# Patient Record
Sex: Male | Born: 1985 | Race: Black or African American | Hispanic: No | Marital: Single | Smoking: Current every day smoker
Health system: Southern US, Community
[De-identification: ages and names within clinical notes are randomized; demographics above are authoritative.]

## PROBLEM LIST (undated history)

## (undated) DIAGNOSIS — K259 Gastric ulcer, unspecified as acute or chronic, without hemorrhage or perforation: Secondary | ICD-10-CM

## (undated) DIAGNOSIS — L98499 Non-pressure chronic ulcer of skin of other sites with unspecified severity: Secondary | ICD-10-CM

## (undated) HISTORY — PX: TONSILLECTOMY: SUR1361

---
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
## 2013-09-30 ENCOUNTER — Encounter (HOSPITAL_BASED_OUTPATIENT_CLINIC_OR_DEPARTMENT_OTHER): Payer: Self-pay | Admitting: Emergency Medicine

## 2013-09-30 ENCOUNTER — Emergency Department (HOSPITAL_BASED_OUTPATIENT_CLINIC_OR_DEPARTMENT_OTHER)
Admission: EM | Admit: 2013-09-30 | Discharge: 2013-09-30 | Disposition: A | Discharge status: Home / Self Care | Payer: Self-pay | Attending: Emergency Medicine | Admitting: Emergency Medicine

## 2013-09-30 DIAGNOSIS — K279 Peptic ulcer, site unspecified, unspecified as acute or chronic, without hemorrhage or perforation: Secondary | ICD-10-CM | POA: Insufficient documentation

## 2013-09-30 DIAGNOSIS — R6883 Chills (without fever): Secondary | ICD-10-CM | POA: Insufficient documentation

## 2013-09-30 DIAGNOSIS — F17200 Nicotine dependence, unspecified, uncomplicated: Secondary | ICD-10-CM | POA: Insufficient documentation

## 2013-09-30 DIAGNOSIS — K625 Hemorrhage of anus and rectum: Secondary | ICD-10-CM | POA: Insufficient documentation

## 2013-09-30 HISTORY — DX: Gastric ulcer, unspecified as acute or chronic, without hemorrhage or perforation: K25.9

## 2013-09-30 LAB — CBC WITH DIFFERENTIAL/PLATELET
BASOS ABS: 0 10*3/uL (ref 0.0–0.1)
BASOS PCT: 1 % (ref 0–1)
EOS PCT: 2 % (ref 0–5)
Eosinophils Absolute: 0.2 10*3/uL (ref 0.0–0.7)
HEMATOCRIT: 35 % — AB (ref 39.0–52.0)
Hemoglobin: 11.8 g/dL — ABNORMAL LOW (ref 13.0–17.0)
Lymphocytes Relative: 51 % — ABNORMAL HIGH (ref 12–46)
Lymphs Abs: 3.2 10*3/uL (ref 0.7–4.0)
MCH: 28.9 pg (ref 26.0–34.0)
MCHC: 33.7 g/dL (ref 30.0–36.0)
MCV: 85.8 fL (ref 78.0–100.0)
MONO ABS: 0.6 10*3/uL (ref 0.1–1.0)
Monocytes Relative: 9 % (ref 3–12)
Neutro Abs: 2.3 10*3/uL (ref 1.7–7.7)
Neutrophils Relative %: 37 % — ABNORMAL LOW (ref 43–77)
Platelets: 267 10*3/uL (ref 150–400)
RBC: 4.08 MIL/uL — ABNORMAL LOW (ref 4.22–5.81)
RDW: 12.8 % (ref 11.5–15.5)
WBC: 6.2 10*3/uL (ref 4.0–10.5)

## 2013-09-30 LAB — COMPREHENSIVE METABOLIC PANEL
ALBUMIN: 3.8 g/dL (ref 3.5–5.2)
ALT: 29 U/L (ref 0–53)
AST: 19 U/L (ref 0–37)
Alkaline Phosphatase: 67 U/L (ref 39–117)
Anion gap: 12 (ref 5–15)
BUN: 12 mg/dL (ref 6–23)
CALCIUM: 9.2 mg/dL (ref 8.4–10.5)
CO2: 24 mEq/L (ref 19–32)
CREATININE: 0.9 mg/dL (ref 0.50–1.35)
Chloride: 108 mEq/L (ref 96–112)
GFR calc Af Amer: >90 mL/min (ref >90)
Glucose, Bld: 83 mg/dL (ref 70–99)
Potassium: 3.9 mEq/L (ref 3.7–5.3)
Sodium: 144 mEq/L (ref 137–147)
Total Bilirubin: 0.3 mg/dL (ref 0.3–1.2)
Total Protein: 6.7 g/dL (ref 6.0–8.3)

## 2013-09-30 LAB — OCCULT BLOOD X 1 CARD TO LAB, STOOL: Fecal Occult Bld: POSITIVE — AB

## 2013-09-30 LAB — LIPASE, BLOOD: LIPASE: 29 U/L (ref 11–59)

## 2013-09-30 MED ORDER — OMEPRAZOLE 20 MG PO CPDR: 20.0000 mg | DELAYED_RELEASE_CAPSULE | Freq: Two times a day (BID) | ORAL | Status: DC

## 2013-09-30 MED ORDER — PANTOPRAZOLE SODIUM 40 MG PO TBEC
40.0000 mg | DELAYED_RELEASE_TABLET | Freq: Once | ORAL | Status: AC
  Administered 2013-09-30: 40 mg via ORAL
  Filled 2013-09-30: qty 1

## 2013-09-30 NOTE — Discharge Instructions (Signed)
Bloody Stools Bloody stools means there is blood in your poop (stool). It is a sign that there is a problem somewhere in the digestive system. It is important for your doctor to find the cause of your bleeding, so the problem can be treated.  HOME CARE  Only take medicine as told by your doctor.  Eat foods with fiber (prunes, bran cereals).  Drink enough fluids to keep your pee (urine) clear or pale yellow.  Sit in warm water (sitz bath) for 10 to 15 minutes as told by your doctor.  Know how to take your medicines (enemas, suppositories) if advised by your doctor.  Watch for signs that you are getting better or getting worse. GET HELP RIGHT AWAY IF:   You are not getting better.  You start to get better but then get worse again.  You have new problems.  You have severe bleeding from the place where poop comes out (rectum) that does not stop.  You throw up (vomit) blood.  You feel weak or pass out (faint).  You have a fever. MAKE SURE YOU:   Understand these instructions.  Will watch your condition.  Will get help right away if you are not doing well or get worse. Document Released: 02/23/2009 Document Revised: 05/30/2011 Document Reviewed: 07/23/2010 [facility] Patient Information 2015 [employer] [place]. This information is not intended to replace advice given to you by your health care provider. Make sure you discuss any questions you have with your health care provider.  Peptic Ulcer A peptic ulcer is a sore in the lining of in your esophagus (esophageal ulcer), stomach (gastric ulcer), or in the first part of your small intestine (duodenal ulcer). The ulcer causes erosion into the deeper tissue. CAUSES  Normally, the lining of the stomach and the small intestine protects itself from the acid that digests food. The protective lining can be damaged by:  An infection caused by a bacterium called Helicobacter pylori (H. pylori).  Regular use of nonsteroidal anti-inflammatory  drugs (NSAIDs), such as ibuprofen or aspirin.  Smoking tobacco. Other risk factors include being older than 50, drinking alcohol excessively, and having a family history of ulcer disease.  SYMPTOMS   Burning pain or gnawing in the area between the chest and the belly button.  Heartburn.  Nausea and vomiting.  Bloating. The pain can be worse on an empty stomach and at night. If the ulcer results in bleeding, it can cause:  Black, tarry stools.  Vomiting of bright red blood.  Vomiting of coffee ground looking materials. DIAGNOSIS  A diagnosis is usually made based upon your history and an exam. Other tests and procedures may be performed to find the cause of the ulcer. Finding a cause will help determine the best treatment. Tests and procedures may include:  Blood tests, stool tests, or breath tests to check for the bacterium H. pylori.  An upper gastrointestinal (GI) series of the esophagus, stomach, and small intestine.  An endoscopy to examine the esophagus, stomach, and small intestine.  A biopsy. TREATMENT  Treatment may include:  Eliminating the cause of the ulcer, such as smoking, NSAIDs, or alcohol.  Medicines to reduce the amount of acid in your digestive tract.  Antibiotic medicines if the ulcer is caused by the H. pylori bacterium.  An upper endoscopy to treat a bleeding ulcer.  Surgery if the bleeding is severe or if the ulcer created a hole somewhere in the digestive system. HOME CARE INSTRUCTIONS   Avoid tobacco, alcohol, and  caffeine. Smoking can increase the acid in the stomach, and continued smoking will impair the healing of ulcers.  Avoid foods and drinks that seem to cause discomfort or aggravate your ulcer.  Only take medicines as directed by your caregiver. Do not substitute over-the-counter medicines for prescription medicines without talking to your caregiver.  Keep any follow-up appointments and tests as directed. SEEK MEDICAL CARE IF:    Your do not improve within 7 days of starting treatment.  You have ongoing indigestion or heartburn. SEEK IMMEDIATE MEDICAL CARE IF:   You have sudden, sharp, or persistent abdominal pain.  You have bloody or dark black, tarry stools.  You vomit blood or vomit that looks like coffee grounds.  You become light headed, weak, or feel faint.  You become sweaty or clammy. MAKE SURE YOU:   Understand these instructions.  Will watch your condition.  Will get help right away if you are not doing well or get worse. Document Released: 03/04/2000 Document Revised: 11/30/2011 Document Reviewed: 10/05/2011 [facility] Patient Information 2015 [place], [place]. This information is not intended to replace advice given to you by your health care provider. Make sure you discuss any questions you have with your health care provider.

## 2013-09-30 NOTE — ED Notes (Signed)
Pa  at bedside. 

## 2013-09-30 NOTE — ED Notes (Signed)
Rectal bleeding since last night.  Denies pain

## 2013-09-30 NOTE — ED Provider Notes (Addendum)
CSN: [id number]     Arrival date & time 09/30/13  1512 History  This chart was scribed for Shanna Cisco, MD by Quintella Reichert, ED scribe.  This patient was seen in room MH06/MH06 and the patient's care was started at 4:19 PM.   Chief Complaint  Patient presents with  . Rectal Bleeding    Patient is a 28 y.o. male presenting with hematochezia. The history is provided by the patient. No language interpreter was used.  Rectal Bleeding Quality:  Maroon Duration: Began last night. Timing: with every bowel movement. Chronicity:  New Similar prior episodes: no   Ineffective treatments:  None tried Associated symptoms: abdominal pain (mild intermittent sharp pains)   Associated symptoms: no dizziness, no fever and no vomiting   Risk factors: no anticoagulant use, no hx of colorectal cancer, no hx of colorectal surgery, no liver disease and no NSAID use     HPI Comments: Dillon Nelson is a 28 y.o. male who presents to the Emergency Department complaining of rectal bleeding that began last night.  Pt states whenever he moves his bowels he notices dark red blood mixed in with his stool and on the outside of the stool.  He has seen this 3 times, once last night and twice today, around 10:30 AM and 12:30 PM.  Stool is otherwise normal.  He denies rectal pain including with bowel movements.  He does reports some mild intermittent sharp abdominal pain.  He had some chills last night but denies fever.  He denies dysuria or hematuria.  Pt denies prior h/o similar symptoms.  He has h/o gastric ulcers about 4 years ago but states his current symptoms are distinct as he experienced hematemesis, more severe abdominal pain, and rectal discharge at that time.  Since then he states that he occasionally experiences intermittent abdominal pains that move around the center of his abdomen and which last about 1-2 days.  She last had one about 1 month ago.  He has h/o painful hemorrhoids as well but states his  current symptoms do not feel similar.  He denies h/o abdominal surgery.  He drinks alcohol occasionally and last drank 5 days ago.  He does not take any OTC medications including NSAIDs or Tylenol, and denies illicit drug use.    Past Medical History  Diagnosis Date  . Multiple gastric ulcers     Past Surgical History  Procedure Laterality Date  . Tonsillectomy      History reviewed. No pertinent family history.   History  Substance Use Topics  . Smoking status: Current Every Day Smoker -- 0.50 packs/day    Types: Cigarettes  . Smokeless tobacco: Not on file  . Alcohol Use: No     Review of Systems  Constitutional: Positive for chills. Negative for fever, activity change, appetite change and fatigue.  HENT: Negative for congestion, facial swelling, rhinorrhea and trouble swallowing.   Eyes: Negative for photophobia and pain.  Respiratory: Negative for cough, chest tightness and shortness of breath.   Cardiovascular: Negative for chest pain and leg swelling.  Gastrointestinal: Positive for abdominal pain (mild intermittent sharp pains), blood in stool and hematochezia. Negative for nausea, vomiting, diarrhea and constipation.  Endocrine: Negative for polydipsia and polyuria.  Genitourinary: Negative for dysuria, urgency, hematuria, decreased urine volume and difficulty urinating.  Musculoskeletal: Negative for back pain and gait problem.  Skin: Negative for color change, rash and wound.  Allergic/Immunologic: Negative for immunocompromised state.  Neurological: Negative for dizziness, facial asymmetry, speech difficulty,  weakness, numbness and headaches.  Psychiatric/Behavioral: Negative for confusion, decreased concentration and agitation.      Allergies  Review of patient's allergies indicates no known allergies.  Home Medications   Prior to Admission medications   Medication Sig Start Date End Date Taking? Authorizing Provider  omeprazole (PRILOSEC) 20 MG capsule  Take 1 capsule (20 mg total) by mouth 2 (two) times daily before a meal. 09/30/13   Shanna Cisco, MD   BP 142/86  Pulse 70  Temp(Src) 98.1 F (36.7 C) (Oral)  Resp 18  Ht 5\' 11"  (1.803 m)  Wt 160 lb (72.576 kg)  BMI 22.33 kg/m2  SpO2 100%  Physical Exam  Nursing note and vitals reviewed. Constitutional: He is oriented to person, place, and time. He appears well-developed and well-nourished. No distress.  HENT:  Head: Normocephalic and atraumatic.  Mouth/Throat: No oropharyngeal exudate.  Eyes: Pupils are equal, round, and reactive to light.  Neck: Normal range of motion. Neck supple.  Cardiovascular: Normal rate, regular rhythm and normal heart sounds.  Exam reveals no gallop and no friction rub.   No murmur heard. Pulmonary/Chest: Effort normal and breath sounds normal. No respiratory distress. He has no wheezes. He has no rales.  Abdominal: Soft. Bowel sounds are normal. He exhibits no distension and no mass. There is tenderness in the epigastric area and left upper quadrant. There is no rebound and no guarding.  Genitourinary:  Scant maroon-colored stool  Musculoskeletal: Normal range of motion. He exhibits no edema and no tenderness.  Neurological: He is alert and oriented to person, place, and time.  Skin: Skin is warm and dry.  Psychiatric: He has a normal mood and affect.    ED Course  Procedures (including critical care time)  DIAGNOSTIC STUDIES: Oxygen Saturation is 100% on room air, normal by my interpretation.    COORDINATION OF CARE: 4:28 PM-Discussed treatment plan which includes labs and stool sample with pt at bedside and pt agreed to plan.     Labs Review Labs Reviewed  CBC WITH DIFFERENTIAL - Abnormal; Notable for the following:    RBC 4.08 (*)    Hemoglobin 11.8 (*)    HCT 35.0 (*)    Neutrophils Relative % 37 (*)    Lymphocytes Relative 51 (*)    All other components within normal limits  OCCULT BLOOD X 1 CARD TO LAB, STOOL - Abnormal; Notable  for the following:    Fecal Occult Bld POSITIVE (*)    All other components within normal limits  COMPREHENSIVE METABOLIC PANEL  LIPASE, BLOOD    Imaging Review No results found.   EKG Interpretation None      MDM   Final diagnoses:  Rectal bleeding  Peptic ulcer disease    Pt is a 28 y.o. male with Pmhx as above who presents with rectal bleeding x3 episodes since last night associated w/ BMs, described as dark red mixed w/ stool. He has intermittent sharp upper abdominal pain. No rectal pain, no fever, n/v, urinary symptoms. On PE, VSS, pt in NAD. +ttp epigastrium and LUQ w/o rebound or guarding. Heme stool positive, scant maroon colored stool. Hx 11.8 w/o priors. Last episode about 5 hrs ago. CMP, lipase nml. Given hx, suspect peptic ulcer disease w/ bleeding ulcer is cause of rectal bleeding. Exam not c/w perforation. As pt is HDS, in a NAD and not reporting significant abdominal pain, will start on BID omeprazole.  Strict return precautions given for new or worsening symptoms including worsening abdominal  pain, fever, hematemesis, inc rectal bleeding, syncope/near syncope.      I personally performed the services described in this documentation, which was scribed in my presence. The recorded information has been reviewed and is accurate.    Shanna CiscoMegan E Liem Copenhaver, MD 09/30/13 1723  Shanna CiscoMegan E Rastus Borton, MD 09/30/13 [phone]

## 2013-10-31 ENCOUNTER — Inpatient Hospital Stay (HOSPITAL_COMMUNITY)
Admission: EM | Admit: 2013-10-31 | Discharge: 2013-10-31 | Discharge status: Against Medical Advice | Payer: Self-pay | Attending: Obstetrics & Gynecology | Admitting: Obstetrics & Gynecology

## 2014-01-02 ENCOUNTER — Emergency Department (HOSPITAL_BASED_OUTPATIENT_CLINIC_OR_DEPARTMENT_OTHER)
Admission: EM | Admit: 2014-01-02 | Discharge: 2014-01-03 | Disposition: A | Discharge status: Home / Self Care | Payer: Self-pay | Attending: Emergency Medicine | Admitting: Emergency Medicine

## 2014-01-02 ENCOUNTER — Encounter (HOSPITAL_BASED_OUTPATIENT_CLINIC_OR_DEPARTMENT_OTHER): Payer: Self-pay | Admitting: Emergency Medicine

## 2014-01-02 ENCOUNTER — Emergency Department (HOSPITAL_COMMUNITY): Payer: Self-pay

## 2014-01-02 DIAGNOSIS — M549 Dorsalgia, unspecified: Secondary | ICD-10-CM | POA: Insufficient documentation

## 2014-01-02 DIAGNOSIS — R200 Anesthesia of skin: Secondary | ICD-10-CM | POA: Insufficient documentation

## 2014-01-02 DIAGNOSIS — M5126 Other intervertebral disc displacement, lumbar region: Secondary | ICD-10-CM | POA: Insufficient documentation

## 2014-01-02 DIAGNOSIS — G839 Paralytic syndrome, unspecified: Secondary | ICD-10-CM

## 2014-01-02 MED ORDER — SODIUM CHLORIDE 0.9 % IV BOLUS (SEPSIS)
1000.0000 mL | Freq: Once | INTRAVENOUS | Status: AC
  Administered 2014-01-02: 1000 mL via INTRAVENOUS

## 2014-01-02 MED ORDER — MORPHINE SULFATE 4 MG/ML IJ SOLN
6.0000 mg | Freq: Once | INTRAMUSCULAR | Status: AC
  Administered 2014-01-02: 6 mg via INTRAVENOUS
  Filled 2014-01-02: qty 2

## 2014-01-02 NOTE — ED Provider Notes (Signed)
CSN: [id number]     Arrival date & time 01/02/14  1858 History   First MD Initiated Contact with Patient 01/02/14 2006     Chief Complaint  Patient presents with  . Numbness     (Consider location/radiation/quality/duration/timing/severity/associated sxs/prior Treatment) HPI Comments:  Patient is a 28 year old male transferred from [facility] long emergency department after having sudden onset of numbness and weakness to bilateral legs yesterday. He was doing his normal activity when his legs  Became numb and he fell to the floor he states his legs  Then went paralyzed.  He states this lasted for about an hour. He denied any urinary or fecal incontinence. He did have some low back pain at this time as well. He states that his symptoms gradually resolved and now  His only complaint is that when he leans upright his back hurts.  He does have some chronic low back pain but he states nothing like this.  Denies any current numbness, weakness.  Pain is alleviated by leaning  Forward.  Seen in [facility] recommended MRIs and transferred here  The history is provided by the patient and medical records.    Past Medical History  Diagnosis Date  . Multiple gastric ulcers    Past Surgical History  Procedure Laterality Date  . Tonsillectomy     No family history on file. History  Substance Use Topics  . Smoking status: Current Every Day Smoker -- 0.50 packs/day    Types: Cigarettes  . Smokeless tobacco: Not on file  . Alcohol Use: Yes    Review of Systems  Constitutional: Negative for fever, activity change and appetite change.  HENT: Negative for congestion and rhinorrhea.   Eyes: Negative for discharge and itching.  Respiratory: Negative for cough, shortness of breath and wheezing.   Cardiovascular: Negative for chest pain.  Gastrointestinal: Negative for nausea, vomiting, abdominal pain, diarrhea and constipation.  Genitourinary: Negative for hematuria, decreased urine volume and  difficulty urinating.  Musculoskeletal: Positive for back pain.  Skin: Negative for rash and wound.  Neurological: Positive for weakness ( resolved) and numbness ( resolved). Negative for syncope.  All other systems reviewed and are negative.     Allergies  Review of patient's allergies indicates no known allergies.  Home Medications   Prior to Admission medications   Medication Sig Start Date End Date Taking? Authorizing Provider  methylPREDNIsolone (MEDROL DOSPACK) 4 MG tablet follow package directions 01/03/14   Pilar Jarvis, MD  omeprazole (PRILOSEC) 20 MG capsule Take 1 capsule (20 mg total) by mouth 2 (two) times daily before a meal. 09/30/13   Toy Cookey, MD   BP 154/87  Pulse 110  Temp(Src) 99.2 F (37.3 C) (Oral)  Resp 16  Ht 5\' 8"  (1.727 m)  Wt 174 lb (78.926 kg)  BMI 26.46 kg/m2  SpO2 98% Physical Exam  Vitals reviewed. Constitutional: He is oriented to person, place, and time. He appears well-developed and well-nourished. No distress.  HENT:  Head: Normocephalic and atraumatic.  Mouth/Throat: Oropharynx is clear and moist. No oropharyngeal exudate.  Eyes: Conjunctivae and EOM are normal. Pupils are equal, round, and reactive to light. Right eye exhibits no discharge. Left eye exhibits no discharge. No scleral icterus.  Neck: Normal range of motion. Neck supple.  Cardiovascular: Normal rate, regular rhythm, normal heart sounds and intact distal pulses.  Exam reveals no gallop and no friction rub.   No murmur heard. Pulmonary/Chest: Effort normal and breath sounds normal. No respiratory distress. He has no wheezes.  He has no rales.  Abdominal: Soft. He exhibits no distension and no mass. There is no tenderness.  Musculoskeletal: Normal range of motion.  No tenderness to palpation of CTLs  Neurological: He is alert and oriented to person, place, and time. No cranial nerve deficit. He exhibits normal muscle tone. Coordination normal.  5 out of 5 strength in all  extremities  normal sensation in all extremities  able to ambulate  But leans forward for  Comfort  2+ DTRs and patellar and brachioradialis Cranial nerves II through XII intact  Skin: Skin is warm. No rash noted. He is not diaphoretic.    ED Course  Procedures (including critical care time) Labs Review Labs Reviewed - No data to display  Imaging Review Mr Lumbar Spine Wo Contrast  01/02/2014   CLINICAL DATA:  Initial valuation for sudden onset numbness in bilateral legs that began yesterday.  EXAM: MRI LUMBAR SPINE WITHOUT CONTRAST  TECHNIQUE: Multiplanar, multisequence MR imaging of the lumbar spine was performed. No intravenous contrast was administered.  COMPARISON:  None available  FINDINGS: For the purposes of this dictation, the lowest well-formed intervertebral disc spaces presumed to be the L5-S1 level, and there presumed to be 5 lumbar type vertebral bodies.  Vertebral bodies are normally aligned with preservation of the normal lumbar lordosis. Vertebral body heights are preserved. Signal intensity within the vertebral body bone marrow is normal. No focal osseous lesion.  The conus medullaris terminates normally at the level of L1. Signal intensity within the visualized cord is within normal limits. Nerve roots of the cauda equina are position dependently within the thecal sac without abnormality.  Paraspinous soft tissues within normal limits.  Diffuse congenital shortening of the pedicles is present.  T12-L1:  Negative.  L1-2:  Negative.  L2-3:  Negative.  L3-4: Small central disc protrusion indents the ventral thecal sac. There is an associated annular fissure. Mild bilateral facet hypertrophy. These changes superimposed on short pedicles results in mild canal and lateral recess stenosis bilaterally. There is mild left foraminal narrowing.  L4-5: There is a central/right paracentral disc protrusion minimally indents the ventral thecal sac. There is mild bilateral facet hypertrophy. These  changes superimposed on short pedicles results in moderate canal and bilateral lateral recess stenosis. There is moderate bilateral foraminal narrowing as well.  L5-S1: Mild diffuse disc bulge with superimposed small central disc protrusion. Mild bilateral facet arthrosis. There is mild canal and moderate bilateral foraminal narrowing.  IMPRESSION: 1. Small central disc protrusion with bilateral facet hypertrophy and short pedicles at L3-4 with resultant mild canal and lateral recess stenosis bilaterally. 2. Small central/right paracentral disc protrusion at L4-5, which superimposed on short pedicles results in moderate canal and bilateral foraminal stenosis. 3. Small central disc protrusion at L5-S1 with resultant mild canal and moderate bilateral foraminal narrowing. 4. Diffuse congenital shortening of the pedicles.   Electronically Signed   By: Rise MuBenjamin  McClintock M.D.   On: 01/02/2014 22:54     EKG Interpretation None      MDM   MDM: 28 year old male  [facility]  Do to paralysis yesterday. Patient here with normal neurological exam. MRI obtained shows results as above. Discussed with Dr. Yetta BarreJones of neurosurgery who feels that patient's symptoms are not explained by his MRI and feels that his MRI is benign. He did not feel patient needs emergent neurosurgical consult or urgent neurosurgical followup.  Recommended  Neurology consultation.  Neurology is seeing patient feels his symptoms are explained  by herniated disc. He recommends a Medrol Dosepak as well as followup as outpatient.  We will discharge patient. Instructed to return if symptoms return.  Patient given morphine for pain  In emergency department.   Symptoms unlikely GBS as not progressive.  Discharged.  Final diagnoses:  Paralysis  Herniated lumbar disc without myelopathy    New Prescriptions   METHYLPREDNISOLONE (MEDROL DOSPACK) 4 MG TABLET    follow package directions      [facility]  [facility]  [facility] [address] [place] [id number] [phone] Schedule an appointment as soon as possible for a visit    Discharged  Pilar Jarvisoug Satonya Lux, MD 01/03/14 [phone]

## 2014-01-02 NOTE — ED Notes (Signed)
Pt states "i went paralyzed from my waist down last night"-states he was unable to walk but now can walk-denies injury-pt also c/o prod cough x 2 days

## 2014-01-02 NOTE — ED Notes (Signed)
Pt c/o numbness and no feeling in lower ext last pm lasting approx 45 minutes,  states legs gave away and he was unable too move and had fo feeling

## 2014-01-02 NOTE — ED Notes (Signed)
Pt called x1 no answer in lobby 

## 2014-01-02 NOTE — ED Provider Notes (Signed)
CSN: [id number]     Arrival date & time 01/02/14  1858 History  This chart was scribed for Nelia Shiobert L Sina Sumpter, MD by Bronson CurbJacqueline Melvin, ED Scribe. This patient was seen in room MH05/MH05 and the patient's care was started at 8:07 PM.      Chief Complaint  Patient presents with  . Numbness    The history is provided by the patient. No language interpreter was used.    HPI Comments: Dillon Nelson is a 28 y.o. male who presents to the Emergency Department complaining of sudden onset of numbness to the bilateral legs that began yesterday. Patient states he was cleaning around the house when his "legs suddenly went numb" and he fell to the floor and his legs were paralyzed and would not move.  He denies urinary or fecal incontinence. He denies any heavy lifting. Wife states the patient was on the floor for 45 minutes to an hour because he was unable to get up. He states he is also experiencing flu-like symptoms and did not immediately come the ED for his numbness because he thought this was related. He also noted associated low back pain. Patient is ambulatory at present, however, he states he is unable to maintain a upright posture. Patient denies bowel/bladder incontinence, or any numbness or weakness at present. Patient has history of chronic back pain, but denies history of back surgery or IV drug use.  Past Medical History  Diagnosis Date  . Multiple gastric ulcers    Past Surgical History  Procedure Laterality Date  . Tonsillectomy     No family history on file. History  Substance Use Topics  . Smoking status: Current Every Day Smoker -- 0.50 packs/day    Types: Cigarettes  . Smokeless tobacco: Not on file  . Alcohol Use: Yes    Review of Systems  Musculoskeletal: Positive for back pain.  Neurological: Positive for weakness and numbness.  All other systems reviewed and are negative.     Allergies  Review of patient's allergies indicates no known allergies.  Home  Medications   Prior to Admission medications   Medication Sig Start Date End Date Taking? Authorizing Provider  omeprazole (PRILOSEC) 20 MG capsule Take 1 capsule (20 mg total) by mouth 2 (two) times daily before a meal. 09/30/13   Toy CookeyMegan Docherty, MD   Triage Vitals: BP 154/87  Pulse 110  Temp(Src) 99.2 F (37.3 C) (Oral)  Resp 16  Ht 5\' 8"  (1.727 m)  Wt 174 lb (78.926 kg)  BMI 26.46 kg/m2  SpO2 98%  Physical Exam Physical Exam  Nursing note and vitals reviewed. Constitutional: He is oriented to person, place, and time. He appears well-developed and well-nourished. No distress.  HENT:  Head: Normocephalic and atraumatic.  Eyes: Pupils are equal, round, and reactive to light.  Neck: Normal range of motion.  Cardiovascular: Normal rate and intact distal pulses.   Pulmonary/Chest: No respiratory distress.  Abdominal: Normal appearance. He exhibits no distension.  Musculoskeletal: Normal range of motion.  Pain in the low lumbar spine area to palpation and with movement.  Neurological: He is alert and oriented to person, place, and time. No cranial nerve deficit.  Patient able to move all extremities although movement of the lower extremity causes pain.  He has no definite numbness to lower trimmings.  Skin: Skin is warm and dry. No rash noted.  Psychiatric: He has a normal mood and affect. His behavior is normal.   ED Course  Procedures (including critical care time)  DIAGNOSTIC STUDIES: Oxygen Saturation is 98% on room air, normal by my interpretation.    COORDINATION OF CARE: At 2014 Discussed treatment plan with patient which includes transport to another facility to have an MRI performed. Patient agrees.   Labs Review Labs Reviewed - No data to display  Imaging Review No results found.  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  His neurological exam is improved from last night when he states that he was totally paralyzed.  He says he can ambulate  but pain in his back is still significant.  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  I spoke with the radiologist who agreed and [facility]  Patient is requesting to go by private vehicle.  Further evaluation can be done after the scan for his "cold symptoms".  MDM   Final diagnoses:  Paralysis      I personally performed the services described in this documentation, which was scribed in my presence. The recorded information has been reviewed and considered.   Nelia Shiobert L Viktorya Arguijo, MD 01/02/14 2040

## 2014-01-02 NOTE — ED Notes (Signed)
Report given to [place]enn[place], charge [facility] at [facility] was given report and expetected time of time of arrival

## 2014-01-03 DIAGNOSIS — G839 Paralytic syndrome, unspecified: Secondary | ICD-10-CM

## 2014-01-03 MED ORDER — METHYLPREDNISOLONE (PAK) 4 MG PO TABS: ORAL_TABLET | ORAL | Status: DC

## 2014-01-03 NOTE — Consult Note (Signed)
Consult Reason for Consult: transient bilateral LE paralysis Referring Physician: Dr Radford Pax  CC: My legs gave out  HPI: Dillon Nelson is a 28 y.o. male who presents to the Emergency Department complaining of sudden onset of numbness to the bilateral legs that began yesterday. Patient states he was cleaning around the house when his "legs suddenly went numb" and he fell to the floor and his legs were paralyzed and would not move. Notes every time he tried to move he would have a severe stabbing pain in his lumbar region. He denies urinary or fecal incontinence. He denies any heavy lifting. Wife states the patient was on the floor for 45 minutes to an hour because he was unable to get up. He notes ever time he tried to get up would have severe pain and need to sit back down. He states he is also experiencing flu-like symptoms and did not immediately come the ED for his numbness because he thought this was related. He also noted associated low back pain. Patient is ambulatory at present, however, he states he is unable to maintain a upright posture. Patient denies bowel/bladder incontinence, or any numbness or weakness at present. Patient has history of chronic back pain, but denies history of back surgery or IV drug use.  MRI L spine completed in ED, results discussed with neurosurgery who felt there was no surgical intervention indicated at this time.   Past Medical History  Diagnosis Date  . Multiple gastric ulcers     Past Surgical History  Procedure Laterality Date  . Tonsillectomy      No family history on file.  Social History:  reports that he has been smoking Cigarettes.  He has been smoking about 0.50 packs per day. He does not have any smokeless tobacco history on file. He reports that he drinks alcohol. He reports that he uses illicit drugs (Marijuana).  No Known Allergies   ROS: Out of a complete 14 system review, the patient complains of only the following symptoms, and all  other reviewed systems are negative.  Physical Examination: Blood pressure 154/87, pulse 110, temperature 99.2 F (37.3 C), temperature source Oral, resp. rate 16, height 5\' 8"  (1.727 m), weight 78.926 kg (174 lb), SpO2 98.00%.  Neurologic Examination Mental Status: Alert, oriented, thought content appropriate.  Speech fluent without evidence of aphasia.  Able to follow 3 step commands without difficulty. Cranial Nerves: II: funduscopic exam wnl bilaterally, visual fields grossly normal, pupils equal, round, reactive to light and accommodation III,IV, VI: ptosis not present, extra-ocular motions intact bilaterally V,VII: smile symmetric, facial light touch sensation normal bilaterally VIII: hearing normal bilaterally IX,X: gag reflex present XI: trapezius strength/neck flexion strength normal bilaterally XII: tongue strength normal  Motor: Right : Upper extremity    Left:     Upper extremity 5/5 deltoid       5/5 deltoid 5/5 biceps      5/5 biceps  5/5 triceps      5/5 triceps 5/5wrist flexion     5/5 wrist flexion 5/5 wrist extension     5/5 wrist extension 5/5 hand grip      5/5 hand grip  Lower extremity     Lower extremity 4+/5 hip flexor      4+/5 hip flexor 5-/5 quadricep      5-/5 quadriceps  5-/5 hamstrings     5-/5 hamstrings 5/5 plantar flexion       5/5 plantar flexion 5/5 plantar extension     5/5 plantar  extension *weakness appears pain related, intermittently able to give brief full strength effor Tone and bulk:normal tone throughout; no atrophy noted Sensory: Pinprick and light touch intact throughout, bilaterally Deep Tendon Reflexes: 1+ and symmetric throughout Plantars: Right: downgoing   Left: downgoing Cerebellar: normal finger-to-nose, due to pain did not attempt HTS Gait: refused walking due to pain  Laboratory Studies:   Basic Metabolic Panel: No results found for this basename: NA, K, CL, CO2, GLUCOSE, BUN, CREATININE, CALCIUM, MG, PHOS,  in the last  168 hours  Liver Function Tests: No results found for this basename: AST, ALT, ALKPHOS, BILITOT, PROT, ALBUMIN,  in the last 168 hours No results found for this basename: LIPASE, AMYLASE,  in the last 168 hours No results found for this basename: AMMONIA,  in the last 168 hours  CBC: No results found for this basename: WBC, NEUTROABS, HGB, HCT, MCV, PLT,  in the last 168 hours  Cardiac Enzymes: No results found for this basename: CKTOTAL, CKMB, CKMBINDEX, TROPONINI,  in the last 168 hours  BNP: No components found with this basename: POCBNP,   CBG: No results found for this basename: GLUCAP,  in the last 168 hours  Microbiology: No results found for this or any previous visit.  Coagulation Studies: No results found for this basename: LABPROT, INR,  in the last 72 hours  Urinalysis: No results found for this basename: COLORURINE, APPERANCEUR, LABSPEC, PHURINE, GLUCOSEU, HGBUR, BILIRUBINUR, KETONESUR, PROTEINUR, UROBILINOGEN, NITRITE, LEUKOCYTESUR,  in the last 168 hours  Lipid Panel:  No results found for this basename: chol, trig, hdl, cholhdl, vldl, ldlcalc    HgbA1C:  No results found for this basename: HGBA1C    Urine Drug Screen:   No results found for this basename: labopia, cocainscrnur, labbenz, amphetmu, thcu, labbarb    Alcohol Level: No results found for this basename: ETH,  in the last 168 hours   Imaging: Mr Lumbar Spine Wo Contrast  01/02/2014   CLINICAL DATA:  Initial valuation for sudden onset numbness in bilateral legs that began yesterday.  EXAM: MRI LUMBAR SPINE WITHOUT CONTRAST  TECHNIQUE: Multiplanar, multisequence MR imaging of the lumbar spine was performed. No intravenous contrast was administered.  COMPARISON:  None available  FINDINGS: For the purposes of this dictation, the lowest well-formed intervertebral disc spaces presumed to be the L5-S1 level, and there presumed to be 5 lumbar type vertebral bodies.  Vertebral bodies are normally aligned  with preservation of the normal lumbar lordosis. Vertebral body heights are preserved. Signal intensity within the vertebral body bone marrow is normal. No focal osseous lesion.  The conus medullaris terminates normally at the level of L1. Signal intensity within the visualized cord is within normal limits. Nerve roots of the cauda equina are position dependently within the thecal sac without abnormality.  Paraspinous soft tissues within normal limits.  Diffuse congenital shortening of the pedicles is present.  T12-L1:  Negative.  L1-2:  Negative.  L2-3:  Negative.  L3-4: Small central disc protrusion indents the ventral thecal sac. There is an associated annular fissure. Mild bilateral facet hypertrophy. These changes superimposed on short pedicles results in mild canal and lateral recess stenosis bilaterally. There is mild left foraminal narrowing.  L4-5: There is a central/right paracentral disc protrusion minimally indents the ventral thecal sac. There is mild bilateral facet hypertrophy. These changes superimposed on short pedicles results in moderate canal and bilateral lateral recess stenosis. There is moderate bilateral foraminal narrowing as well.  L5-S1: Mild diffuse disc bulge with superimposed  small central disc protrusion. Mild bilateral facet arthrosis. There is mild canal and moderate bilateral foraminal narrowing.  IMPRESSION: 1. Small central disc protrusion with bilateral facet hypertrophy and short pedicles at L3-4 with resultant mild canal and lateral recess stenosis bilaterally. 2. Small central/right paracentral disc protrusion at L4-5, which superimposed on short pedicles results in moderate canal and bilateral foraminal stenosis. 3. Small central disc protrusion at L5-S1 with resultant mild canal and moderate bilateral foraminal narrowing. 4. Diffuse congenital shortening of the pedicles.   Electronically Signed   By: Rise MuBenjamin  McClintock M.D.   On: 01/02/2014 22:54      Assessment/Plan:  Felicie MornChristopher Nelson is a 28 y.o. male who presents to the Emergency Department complaining of sudden onset of numbness/weakness to the bilateral legs yesterday that lasted for 45 minutes and then resolved. Currently with severe lumbar back pain and pain related weakness. Suspect symptoms are likely related to pain from a herniated disc. Based on clinical history and resolution of symptoms this history is not consistent with a GBS type picture. Imaging reviewed by neurosurgery who felt patient did not warrant urgent neurosurgical intervention. Suggest Medrol dose pack and outpatient neurology follow up for EMG/NCS.     Elspeth Choeter Dwaine Pringle, DO [facility] [phone]  If 7pm- 7am, please page neurology on call as listed in [facility]. 01/03/2014, 12:06 AM

## 2014-01-03 NOTE — ED Provider Notes (Signed)
I have personally seen and examined the patient.  I have discussed the plan of care with the resident.  I have reviewed the documentation on PMH/FH/Soc. History.  I have reviewed the documentation of the resident and agree.  Pt stable in the ED, no focal motor deficits in his lower extremities, consults appreciated, appropriate for d/c home  Joya Gaskinsonald W Ivanna Kocak, MD 01/03/14 1229

## 2014-01-03 NOTE — Discharge Instructions (Signed)
Herniated Disk A herniated disk occurs when a disk in your spine bulges out too far. This condition is also called a ruptured disk or slipped disk. Your spine (backbone) is made up of bones called vertebrae. Between each pair of vertebrae is an oval disk with a soft, spongy center that acts as a shock absorber when you move. The spongy center is surrounded by a tough outer ring. When you have a herniated disk, the spongy center of the disk bulges out or ruptures through the outer ring. A herniated disk can press on a nerve between your vertebrae and cause pain. A herniated disk can occur anywhere in your back or neck area, but the lower back is the most common spot. CAUSES  In many cases, a herniated disk occurs just from getting older. As you age, the spongy insides of your disks tend to shrink and dry out. A herniated disk can result from gradual wear and tear. Injury or sudden strain can also cause a herniated disk.  RISK FACTORS Aging is the main risk factor for a herniated disk. Other risk factors include:  Being a man between the ages of 30 and 50 years.  Having a job that requires heavy lifting, bending, or twisting.  Having a job that requires long hours of driving.  Not getting enough exercise.  Being overweight.  Smoking. SIGNS AND SYMPTOMS  Signs and symptoms depend on which disk is herniated.  For a herniated disk in the lower back, you may have sharp pain in:  One part of your leg, hip, or buttocks.  The back of your calf.  The top or sole of your foot (sciatica).   For a herniated disk in the neck, you may feel pain:  When you move your neck.  Near or over your shoulder blade.  That moves to your upper arm, forearm, or fingers.   You may also have muscle weakness. It may be hard to:  Lift your leg or arm.  Stand on your toes.  Squeeze tightly with one of your hands.  Other symptoms can include:  Numbness or tingling in the affected areas of your  body.  Loss of bladder or bowel control. This is a rare but serious sign of a severe herniated disk in the lower back. DIAGNOSIS  Your health care provider will do a physical exam. During this exam, you may have to move certain body parts or assume various positions. For example, your health care provider may do the straight-leg test. This is a good way to test for a herniated disk in your lower back. In this test, the health care provider lifts your leg while you lie on your back. This is to see if you feel pain down your leg. Your health care provider will also check for numbness or loss of feeling.  Your health care provider will also check your:  Reflexes.  Muscle strength.  Posture.  Other tests may be done to help in making a diagnosis. These may include:  An X-ray of the spine to rule out other causes of back pain.   Other imaging studies, such as an MRI or CT scan. This is to check whether the herniated disk is pressing on your spinal canal.  Electromyography (EMG). This test checks the nerves that control muscles. It is sometimes used to identify the specific area of nerve involvement.  TREATMENT  In many cases, herniated disk symptoms go away over a period of days or weeks. You will most   likely be free of symptoms in 3-4 months. Treatment may include the following:  The initial treatment for a herniated disk is ashort period of rest.  Bed rest is often limited to 1 or 2 days. Resting for too long delays recovery.  If you have a herniated disk in your lower back, you should avoid sitting as much as possible because sitting increases pressure on the disk.  Medicines. These may include:   Nonsteroidal anti-inflammatory drugs (NSAIDs).  Muscle relaxants for back spasms.  Narcotic pain medicine if your pain is very bad.   Steroid injections. You may need these along the involved nerve root to help control pain. The steroid is injected in the area of the herniated disk.  It helps by reducing swelling around the disk.  Physical therapy. This may include exercises to strengthen the muscles that help support your spine.   You may need surgery if other treatments do not work.  HOME CARE INSTRUCTIONS Follow all your health care provider's instructions. These may include:  Take all medicines as directed by your health care provider.  Rest for 2 days and then start moving.  Do not sit or stand for long periods of time.  Maintain good posture when sitting and standing.  Avoid movements that cause pain, such as bending or lifting.  When you are able to start lifting things again:  Bend with your knees.  Keep your back straight.  Hold heavy objects close to your body.  If you are overweight, ask your health care provider to help you start a weight-loss program.  When you are able to start exercising, ask your health care provider how much and what type of exercise is best for you.  Work with a physical therapist on stretching and strengthening exercises for your back.  Do not wear high-heeled shoes.  Do not sleep on your belly.  Do not smoke.  Keep all follow-up visits as directed by your health care provider. SEEK MEDICAL CARE IF:  You have back or neck pain that is not getting better after 4 weeks.  You have very bad pain in your back or neck.  You develop numbness, tingling, or weakness along with pain. SEEK IMMEDIATE MEDICAL CARE IF:   You have numbness, tingling, or weakness that makes you unable to use your arms or legs.  You lose control of your bladder or bowels.  You have dizziness or fainting.  You have shortness of breath.  MAKE SURE YOU:   Understand these instructions.  Will watch your condition.  Will get help right away if you are not doing well or get worse. Document Released: 03/04/2000 Document Revised: 07/22/2013 Document Reviewed: 02/08/2013 Exit[employer] Patient Information 2015 [employer]. This information  is not intended to replace advice given to you by your health care provider. Make sure you discuss any questions you have with your health care provider.  

## 2014-06-12 ENCOUNTER — Encounter (HOSPITAL_BASED_OUTPATIENT_CLINIC_OR_DEPARTMENT_OTHER): Payer: Self-pay

## 2014-06-12 ENCOUNTER — Emergency Department (HOSPITAL_BASED_OUTPATIENT_CLINIC_OR_DEPARTMENT_OTHER)
Admission: EM | Admit: 2014-06-12 | Discharge: 2014-06-12 | Disposition: A | Discharge status: Home / Self Care | Payer: Worker's Compensation | Attending: Emergency Medicine | Admitting: Emergency Medicine

## 2014-06-12 DIAGNOSIS — Z720 Tobacco use: Secondary | ICD-10-CM | POA: Insufficient documentation

## 2014-06-12 DIAGNOSIS — Z79899 Other long term (current) drug therapy: Secondary | ICD-10-CM | POA: Insufficient documentation

## 2014-06-12 DIAGNOSIS — Y9289 Other specified places as the place of occurrence of the external cause: Secondary | ICD-10-CM | POA: Insufficient documentation

## 2014-06-12 DIAGNOSIS — Y288XXA Contact with other sharp object, undetermined intent, initial encounter: Secondary | ICD-10-CM | POA: Insufficient documentation

## 2014-06-12 DIAGNOSIS — S51812A Laceration without foreign body of left forearm, initial encounter: Secondary | ICD-10-CM | POA: Insufficient documentation

## 2014-06-12 DIAGNOSIS — IMO0002 Reserved for concepts with insufficient information to code with codable children: Secondary | ICD-10-CM

## 2014-06-12 DIAGNOSIS — Y990 Civilian activity done for income or pay: Secondary | ICD-10-CM | POA: Insufficient documentation

## 2014-06-12 DIAGNOSIS — Y9389 Activity, other specified: Secondary | ICD-10-CM | POA: Insufficient documentation

## 2014-06-12 DIAGNOSIS — Z8719 Personal history of other diseases of the digestive system: Secondary | ICD-10-CM | POA: Insufficient documentation

## 2014-06-12 MED ORDER — IBUPROFEN 600 MG PO TABS: 600.0000 mg | ORAL_TABLET | Freq: Four times a day (QID) | ORAL | Status: DC | PRN

## 2014-06-12 MED ORDER — LIDOCAINE-EPINEPHRINE-TETRACAINE (LET) SOLUTION
NASAL | Status: AC
  Administered 2014-06-12: 3 mL
  Filled 2014-06-12: qty 3

## 2014-06-12 MED ORDER — TETANUS-DIPHTH-ACELL PERTUSSIS 5-2.5-18.5 LF-MCG/0.5 IM SUSP
0.5000 mL | Freq: Once | INTRAMUSCULAR | Status: DC
  Filled 2014-06-12: qty 0.5

## 2014-06-12 NOTE — ED Provider Notes (Signed)
CSN: [id number]     Arrival date & time 06/12/14  0545 History   First MD Initiated Contact with Patient 06/12/14 [phone]     Chief Complaint  Patient presents with  . Laceration     (Consider location/radiation/quality/duration/timing/severity/associated sxs/prior Treatment) Patient is a 29 y.o. male presenting with skin laceration. The history is provided by the patient.  Laceration Location:  Shoulder/arm Shoulder/arm laceration location:  L forearm Depth:  Through dermis Quality: straight   Bleeding: controlled   Laceration mechanism:  Metal edge Pain details:    Quality:  Aching   Severity:  Mild   Timing:  Constant   Progression:  Unchanged Foreign body present:  No foreign bodies Relieved by:  Nothing Worsened by:  Nothing tried Ineffective treatments:  None tried Tetanus status:  Unknown   Past Medical History  Diagnosis Date  . Multiple gastric ulcers    Past Surgical History  Procedure Laterality Date  . Tonsillectomy     History reviewed. No pertinent family history. History  Substance Use Topics  . Smoking status: Current Every Day Smoker -- 0.50 packs/day    Types: Cigarettes  . Smokeless tobacco: Not on file  . Alcohol Use: Yes    Review of Systems  All other systems reviewed and are negative.     Allergies  Review of patient's allergies indicates no known allergies.  Home Medications   Prior to Admission medications   Medication Sig Start Date End Date Taking? Authorizing Provider  ibuprofen (ADVIL,MOTRIN) 600 MG tablet Take 1 tablet (600 mg total) by mouth every 6 (six) hours as needed. 06/12/14   Etosha Wetherell, MD  methylPREDNIsolone (MEDROL DOSPACK) 4 MG tablet follow package directions 01/03/14   Pilar Jarvis, MD  omeprazole (PRILOSEC) 20 MG capsule Take 1 capsule (20 mg total) by mouth 2 (two) times daily before a meal. 09/30/13   Toy Cookey, MD   BP 154/95 mmHg  Pulse 60  Temp(Src) 98 F (36.7 C) (Oral)  Resp 18  Ht   (1.778 m)  Wt 167 lb (75.751 kg)  BMI 23.96 kg/m2  SpO2 98% Physical Exam  Constitutional: He is oriented to person, place, and time. He appears well-developed and well-nourished. No distress.  HENT:  Head: Normocephalic and atraumatic.  Mouth/Throat: Oropharynx is clear and moist.  Eyes: Conjunctivae are normal. Pupils are equal, round, and reactive to light.  Neck: Normal range of motion. Neck supple.  Cardiovascular: Normal rate, regular rhythm and intact distal pulses.   Pulmonary/Chest: Effort normal and breath sounds normal. No respiratory distress. He has no wheezes.  Abdominal: Soft. Bowel sounds are normal. He exhibits no distension.  Musculoskeletal: Normal range of motion.       Arms: Neurological: He is alert and oriented to person, place, and time.  Skin: Skin is warm and dry.  Psychiatric: He has a normal mood and affect.    ED Course  Procedures (including critical care time) Labs Review Labs Reviewed - No data to display  Imaging Review No results found.   EKG Interpretation None      MDM   Final diagnoses:  Laceration    LACERATION REPAIR Performed by: Jasmine Awe Authorized by: Jasmine Awe Consent: Verbal consent obtained. Risks and benefits: risks, benefits and alternatives were discussed Consent given by: patient Patient identity confirmed: provided demographic data Prepped and Draped in normal sterile fashion Wound explored  Laceration Location: left dorsal forearm  Laceration Length: 3 cm  No Foreign Bodies seen or palpated  Anesthesia: LET  Irrigation method: syringe Amount of cleaning: standard  Skin closure: staples  Number of sutures: 7  Technique: staples  Patient tolerance: Patient tolerated the procedure well with no immediate complications.    Staple removal in 10 days at urgent care   Naeemah Jasmer, MD 06/12/14 [id number]

## 2014-06-12 NOTE — ED Notes (Signed)
Pt states at work, cutting a band off and cut himself to lt forearm with a box cutter, one inch lac noted, bleeding control

## 2014-08-26 ENCOUNTER — Encounter (HOSPITAL_BASED_OUTPATIENT_CLINIC_OR_DEPARTMENT_OTHER): Payer: Self-pay | Admitting: Emergency Medicine

## 2014-08-26 ENCOUNTER — Emergency Department (HOSPITAL_BASED_OUTPATIENT_CLINIC_OR_DEPARTMENT_OTHER)
Admission: EM | Admit: 2014-08-26 | Discharge: 2014-08-26 | Disposition: A | Discharge status: Home / Self Care | Payer: Self-pay | Attending: Emergency Medicine | Admitting: Emergency Medicine

## 2014-08-26 DIAGNOSIS — R112 Nausea with vomiting, unspecified: Secondary | ICD-10-CM | POA: Insufficient documentation

## 2014-08-26 DIAGNOSIS — Z720 Tobacco use: Secondary | ICD-10-CM | POA: Insufficient documentation

## 2014-08-26 DIAGNOSIS — R1110 Vomiting, unspecified: Secondary | ICD-10-CM

## 2014-08-26 DIAGNOSIS — Z872 Personal history of diseases of the skin and subcutaneous tissue: Secondary | ICD-10-CM | POA: Insufficient documentation

## 2014-08-26 DIAGNOSIS — R197 Diarrhea, unspecified: Secondary | ICD-10-CM | POA: Insufficient documentation

## 2014-08-26 HISTORY — DX: Non-pressure chronic ulcer of skin of other sites with unspecified severity: L98.499

## 2014-08-26 LAB — CBC WITH DIFFERENTIAL/PLATELET
Basophils Absolute: 0 10*3/uL (ref 0.0–0.1)
Basophils Relative: 1 % (ref 0–1)
Eosinophils Absolute: 0.3 10*3/uL (ref 0.0–0.7)
Eosinophils Relative: 4 % (ref 0–5)
HCT: 35.7 % — ABNORMAL LOW (ref 39.0–52.0)
Hemoglobin: 12 g/dL — ABNORMAL LOW (ref 13.0–17.0)
Lymphocytes Relative: 51 % — ABNORMAL HIGH (ref 12–46)
Lymphs Abs: 4.1 10*3/uL — ABNORMAL HIGH (ref 0.7–4.0)
MCH: 28.7 pg (ref 26.0–34.0)
MCHC: 33.6 g/dL (ref 30.0–36.0)
MCV: 85.4 fL (ref 78.0–100.0)
Monocytes Absolute: 0.6 10*3/uL (ref 0.1–1.0)
Monocytes Relative: 8 % (ref 3–12)
Neutro Abs: 2.8 10*3/uL (ref 1.7–7.7)
Neutrophils Relative %: 36 % — ABNORMAL LOW (ref 43–77)
Platelets: 294 10*3/uL (ref 150–400)
RBC: 4.18 MIL/uL — ABNORMAL LOW (ref 4.22–5.81)
RDW: 12.3 % (ref 11.5–15.5)
WBC: 7.9 10*3/uL (ref 4.0–10.5)

## 2014-08-26 LAB — URINALYSIS, ROUTINE W REFLEX MICROSCOPIC
Bilirubin Urine: NEGATIVE
Glucose, UA: NEGATIVE mg/dL
Hgb urine dipstick: NEGATIVE
Ketones, ur: NEGATIVE mg/dL
Leukocytes, UA: NEGATIVE
Nitrite: NEGATIVE
Protein, ur: NEGATIVE mg/dL
Specific Gravity, Urine: 1.03 (ref 1.005–1.030)
Urobilinogen, UA: 1 mg/dL (ref 0.0–1.0)
pH: 6 (ref 5.0–8.0)

## 2014-08-26 LAB — COMPREHENSIVE METABOLIC PANEL
ALT: 30 U/L (ref 17–63)
AST: 28 U/L (ref 15–41)
Albumin: 4.2 g/dL (ref 3.5–5.0)
Alkaline Phosphatase: 66 U/L (ref 38–126)
Anion gap: 6 (ref 5–15)
BUN: 15 mg/dL (ref 6–20)
CO2: 26 mmol/L (ref 22–32)
Calcium: 8.8 mg/dL — ABNORMAL LOW (ref 8.9–10.3)
Chloride: 107 mmol/L (ref 101–111)
Creatinine, Ser: 1.02 mg/dL (ref 0.61–1.24)
GFR calc Af Amer: >60 mL/min (ref >60)
GFR calc non Af Amer: >60 mL/min (ref >60)
Glucose, Bld: 92 mg/dL (ref 65–99)
Potassium: 3.4 mmol/L — ABNORMAL LOW (ref 3.5–5.1)
Sodium: 139 mmol/L (ref 135–145)
Total Bilirubin: 0.3 mg/dL (ref 0.3–1.2)
Total Protein: 7.1 g/dL (ref 6.5–8.1)

## 2014-08-26 LAB — LIPASE, BLOOD: LIPASE: 11 U/L — AB (ref 22–51)

## 2014-08-26 MED ORDER — ONDANSETRON HCL 4 MG/2ML IJ SOLN
4.0000 mg | Freq: Once | INTRAMUSCULAR | Status: AC
  Administered 2014-08-26: 4 mg via INTRAVENOUS
  Filled 2014-08-26: qty 2

## 2014-08-26 MED ORDER — SODIUM CHLORIDE 0.9 % IV BOLUS (SEPSIS)
1000.0000 mL | Freq: Once | INTRAVENOUS | Status: AC
Start: 2014-08-26 — End: 2014-08-26
  Administered 2014-08-26: 1000 mL via INTRAVENOUS

## 2014-08-26 NOTE — ED Notes (Signed)
Patient states that he is having pain in his mid abdominal area. Denies any n/v

## 2014-08-26 NOTE — ED Provider Notes (Signed)
CSN: [id number]     Arrival date & time 08/26/14  1951 History  This chart was scribed for Gwyneth Sprout, MD by Richarda Overlie, ED Scribe. This patient was seen in room MH09/MH09 and the patient's care was started 9:01 PM.  Chief Complaint  Patient presents with  . Abdominal Pain   HPI HPI Comments: Dillon Nelson is a 29 y.o. male who presents to the Emergency Department complaining of abdominal pain that started yesterday afternoon. Pt reports associated nausea, vomiting and diarrhea. He says that he has had between 5-10 episodes of diarrhea today. Pt reports that the last time he vomited was last night and says he vomited twice. He says he is experiencing some mild nausea currently. He denies any recent international travel. Pt denies any recent antibiotics use in the last several months. He denies any hx of abdominal surgeries. He denies any recent heavy drinking. He states he drinks no medications regularly. Pt reports NKDA. Pt denies any urinary symptoms at this time.   Pt also complains of mid, left back pain that wraps around to his left ribs. He states that this pain started 4 days ago.   Past Medical History  Diagnosis Date  . Ulcer of abdomen wall    Past Surgical History  Procedure Laterality Date  . Tonsillectomy     History reviewed. No pertinent family history. History  Substance Use Topics  . Smoking status: Current Every Day Smoker  . Smokeless tobacco: Not on file  . Alcohol Use: No    Review of Systems  Gastrointestinal: Positive for nausea, vomiting and abdominal pain.  Genitourinary: Negative for dysuria.  A complete 10 system review of systems was obtained and all systems are negative except as noted in the HPI and PMH.  Allergies  Review of patient's allergies indicates no known allergies.  Home Medications   Prior to Admission medications   Not on File   BP 131/81 mmHg  Pulse 65  Temp(Src) 98.1 F (36.7 C) (Oral)  Resp 16  Ht  (1.753 m)   Wt 165 lb (74.844 kg)  BMI 24.36 kg/m2  SpO2 100%   Physical Exam  Constitutional: He is oriented to person, place, and time. He appears well-developed and well-nourished.  HENT:  Head: Normocephalic and atraumatic.  Eyes: Right eye exhibits no discharge. Left eye exhibits no discharge.  Neck: No tracheal deviation present.  Cardiovascular: Normal rate, regular rhythm and normal heart sounds.   Pulmonary/Chest: Effort normal. No respiratory distress. He has no wheezes. He has no rales.  Abdominal: He exhibits no distension. There is no rebound and no guarding.  Left CVA tenderness. Mild epigastric tenderness.   Neurological: He is alert and oriented to person, place, and time.  Skin: Skin is warm and dry.  Psychiatric: He has a normal mood and affect.  Nursing note and vitals reviewed.   ED Course  Procedures   DIAGNOSTIC STUDIES: Oxygen Saturation is 100% on RA, normal by my interpretation.    COORDINATION OF CARE: 9:05 PM Discussed treatment plan with pt at bedside and pt agreed to plan.  Labs Review Labs Reviewed  CBC WITH DIFFERENTIAL/PLATELET - Abnormal; Notable for the following:    RBC 4.18 (*)    Hemoglobin 12.0 (*)    HCT 35.7 (*)    Neutrophils Relative % 36 (*)    Lymphocytes Relative 51 (*)    Lymphs Abs 4.1 (*)    All other components within normal limits  COMPREHENSIVE METABOLIC PANEL -  Abnormal; Notable for the following:    Potassium 3.4 (*)    Calcium 8.8 (*)    All other components within normal limits  LIPASE, BLOOD - Abnormal; Notable for the following:    Lipase 11 (*)    All other components within normal limits  URINALYSIS, ROUTINE W REFLEX MICROSCOPIC (NOT AT [facility])    Imaging Review No results found.   EKG Interpretation None      MDM   Final diagnoses:  None   Pt with symptoms most consistent with a viral process with fever/vomitting/diarrhea.  Denies bad food exposure and recent travel out of the country.  No recent abx.  No hx  concerning for GU pathology or kidney stones.  Pt is awake and alert on exam without peritoneal signs.  CBC, CMP, lipase, UA are all within normal limits. After IV fluid and Zofran patient is feeling better. Patient was by mouth challenged help further vomiting.  We'll discharge patient home   I personally performed the services described in this documentation, which was scribed in my presence.  The recorded information has been reviewed and considered.      Gwyneth SproutWhitney Tamaira Ciriello, MD 08/26/14 [phone]

## 2014-08-26 NOTE — ED Notes (Addendum)
Patient reports abdominal pain which began today while at work.  Reports lightheadedness.  Reports N/V/D.  Reports diarrhea "all day" and vomiting last night only.  Reports >5 episodes of diarrhea but <10 today.

## 2014-10-07 ENCOUNTER — Emergency Department (HOSPITAL_BASED_OUTPATIENT_CLINIC_OR_DEPARTMENT_OTHER)
Admission: EM | Admit: 2014-10-07 | Discharge: 2014-10-07 | Disposition: A | Discharge status: Home / Self Care | Payer: Self-pay | Attending: Emergency Medicine | Admitting: Emergency Medicine

## 2014-10-07 ENCOUNTER — Encounter (HOSPITAL_BASED_OUTPATIENT_CLINIC_OR_DEPARTMENT_OTHER): Payer: Self-pay | Admitting: Emergency Medicine

## 2014-10-07 DIAGNOSIS — Z7982 Long term (current) use of aspirin: Secondary | ICD-10-CM | POA: Insufficient documentation

## 2014-10-07 DIAGNOSIS — Z8719 Personal history of other diseases of the digestive system: Secondary | ICD-10-CM | POA: Insufficient documentation

## 2014-10-07 DIAGNOSIS — Z720 Tobacco use: Secondary | ICD-10-CM | POA: Insufficient documentation

## 2014-10-07 DIAGNOSIS — M545 Low back pain, unspecified: Secondary | ICD-10-CM

## 2014-10-07 DIAGNOSIS — Z79899 Other long term (current) drug therapy: Secondary | ICD-10-CM | POA: Insufficient documentation

## 2014-10-07 MED ORDER — HYDROMORPHONE HCL 1 MG/ML IJ SOLN
2.0000 mg | Freq: Once | INTRAMUSCULAR | Status: AC
  Administered 2014-10-07: 2 mg via INTRAMUSCULAR
  Filled 2014-10-07: qty 2

## 2014-10-07 MED ORDER — OXYCODONE-ACETAMINOPHEN 5-325 MG PO TABS: 1.0000 | ORAL_TABLET | Freq: Four times a day (QID) | ORAL | Status: DC | PRN

## 2014-10-07 NOTE — ED Notes (Signed)
Pt states that he developed sharp pain to lower back Sunday night, tonight it "froze up " on him and he is unable to stretch his legs out. Pt reports problems with back in the past

## 2014-10-07 NOTE — ED Notes (Signed)
Low back pain onset Sunday,

## 2014-10-07 NOTE — ED Provider Notes (Addendum)
CSN: [id number]     Arrival date & time 10/07/14  0145 History   First MD Initiated Contact with Patient 10/07/14 0200     Chief Complaint  Patient presents with  . Back Pain     (Consider location/radiation/quality/duration/timing/severity/associated sxs/prior Treatment) HPI  This is a 29 year old male with a history of degenerative low back pain. He is here with a 2 day history of pain in his lower back. The pain is midline. He states the pain worsened overnight and his back feels as if it is "frozen up". He finds it difficult to stretch his legs out. He was observed by nursing staff to be able to stand from wheelchair and ambulate to his bed. Once he was in bed he was seen to completely stretch out his legs and leg in a supine position. He states his pain is severe and worse with movement. He is having some paresthesias in his lower extremities but not numbness or weakness. He denies bowel or bladder changes. He denies saddle anesthesia.  Past Medical History  Diagnosis Date  . Multiple gastric ulcers    Past Surgical History  Procedure Laterality Date  . Tonsillectomy     History reviewed. No pertinent family history. History  Substance Use Topics  . Smoking status: Current Every Day Smoker -- 0.50 packs/day    Types: Cigarettes  . Smokeless tobacco: Not on file  . Alcohol Use: Yes    Review of Systems  All other systems reviewed and are negative.   Allergies  Review of patient's allergies indicates no known allergies.  Home Medications   Prior to Admission medications   Medication Sig Start Date End Date Taking? Authorizing Provider  aspirin 325 MG tablet Take 325 mg by mouth daily.   Yes Historical Provider, MD  ibuprofen (ADVIL,MOTRIN) 600 MG tablet Take 1 tablet (600 mg total) by mouth every 6 (six) hours as needed. 06/12/14   April Palumbo, MD  methylPREDNIsolone (MEDROL DOSPACK) 4 MG tablet follow package directions 01/03/14   Pilar Jarvisoug Brtalik, MD  omeprazole  (PRILOSEC) 20 MG capsule Take 1 capsule (20 mg total) by mouth 2 (two) times daily before a meal. 09/30/13   Toy CookeyMegan Docherty, MD   BP 134/92 mmHg  Pulse 67  Temp(Src) 98 F (36.7 C) (Oral)  Resp 18  Ht 5\' 9"  (1.753 m)  Wt 160 lb (72.576 kg)  BMI 23.62 kg/m2  SpO2 100%   Physical Exam  General: Well-developed, well-nourished male in no acute distress; appearance consistent with age of record; lying fully supine HENT: normocephalic; atraumatic Eyes: pupils equal, round and reactive to light; extraocular muscles intact Neck: supple Heart: regular rate and rhythm Lungs: clear to auscultation bilaterally Abdomen: soft; nondistended Back: Midline lumbar tenderness; positive straight leg raise at 45 bilaterally Extremities: No deformity; full range of motion except at hips due to pain; pulses normal Neurologic: Awake, alert and oriented; motor function intact in all extremities and symmetric; no facial droop; no saddle anesthesia; sensation intact in lower extremities and symmetric Skin: Warm and dry Psychiatric: Flat affect    ED Course  Procedures (including critical care time)   MDM  MRI in October of last year showed degenerative changes. We will refer to neurosurgery for further evaluation and treatment.   Paula LibraJohn Jalani Rominger, MD 10/07/14 [phone]  Paula LibraJohn Thomasine Klutts, MD 10/07/14 [phone]

## 2014-10-07 NOTE — ED Notes (Signed)
Pt able to stand up from wheelchair and ambulate to the bed, once in bed pt was able to completely stretch out legs and lay down in a supine position

## 2014-10-09 ENCOUNTER — Emergency Department (HOSPITAL_BASED_OUTPATIENT_CLINIC_OR_DEPARTMENT_OTHER)
Admission: EM | Admit: 2014-10-09 | Discharge: 2014-10-10 | Disposition: A | Discharge status: Home / Self Care | Payer: Self-pay | Attending: Emergency Medicine | Admitting: Emergency Medicine

## 2014-10-09 ENCOUNTER — Encounter (HOSPITAL_BASED_OUTPATIENT_CLINIC_OR_DEPARTMENT_OTHER): Payer: Self-pay

## 2014-10-09 DIAGNOSIS — Z872 Personal history of diseases of the skin and subcutaneous tissue: Secondary | ICD-10-CM | POA: Insufficient documentation

## 2014-10-09 DIAGNOSIS — Z720 Tobacco use: Secondary | ICD-10-CM | POA: Insufficient documentation

## 2014-10-09 DIAGNOSIS — F1210 Cannabis abuse, uncomplicated: Secondary | ICD-10-CM | POA: Insufficient documentation

## 2014-10-09 DIAGNOSIS — M62830 Muscle spasm of back: Secondary | ICD-10-CM | POA: Insufficient documentation

## 2014-10-09 NOTE — ED Notes (Signed)
Patient presents to the ED with back pain. Patient was seen here recently for the pain and reports still having back pain after being diagnosed with "two slipped disks". The patient reports his pain medicine that was prescribed is making him sleepy but not helping with the pain.

## 2014-10-09 NOTE — ED Provider Notes (Signed)
CSN: [id number]     Arrival date & time 10/09/14  2207 History  This chart was scribed for Loriel Diehl, MD by Budd Palmer, ED Scribe. This patient was seen in room MH10/MH10 and the patient's care was started at 11:36 PM.    Chief Complaint  Patient presents with  . Back Pain   Patient is a 29 y.o. male presenting with back pain. The history is provided by the patient. No language interpreter was used.  Back Pain Location:  Sacro-iliac joint Quality:  Stabbing Pain severity:  Moderate Pain is:  Same all the time Onset quality:  Gradual Timing:  Constant Progression:  Unchanged Chronicity:  Chronic Context: not emotional stress and not falling   Relieved by:  Being still and narcotics Worsened by:  Movement Associated symptoms: no abdominal pain, no abdominal swelling, no bladder incontinence, no bowel incontinence, no chest pain, no dysuria, no fever, no headaches, no leg pain, no numbness, no paresthesias, no pelvic pain, no perianal numbness, no tingling, no weakness and no weight loss   Risk factors: no hx of cancer    HPI Comments: Afnan Emberton is a 29 y.o. male who presents to the Emergency Department complaining of constant, sharp, lower back pain onset. He states the pain shoots down to feet. He has been diagnosed with 2 slipped disks for which he has been prescribed percocet which he states does not provide adequate relief and only makes him sleepy. He works as a Financial risk analyst. Pt denies urinary symptoms.  Past Medical History  Diagnosis Date  . Ulcer of abdomen wall    Past Surgical History  Procedure Laterality Date  . Tonsillectomy     No family history on file. History  Substance Use Topics  . Smoking status: Current Every Day Smoker -- 0.50 packs/day    Types: Cigarettes  . Smokeless tobacco: Not on file  . Alcohol Use: No    Review of Systems  Constitutional: Negative for fever, weight loss and unexpected weight change.  Respiratory: Negative for cough.    Cardiovascular: Negative for chest pain.  Gastrointestinal: Negative for abdominal pain and bowel incontinence.  Genitourinary: Negative for bladder incontinence, dysuria and pelvic pain.  Musculoskeletal: Positive for back pain. Negative for myalgias, joint swelling and arthralgias.  Skin: Negative for color change and pallor.  Neurological: Negative for tingling, weakness, numbness, headaches and paresthesias.  All other systems reviewed and are negative.  Allergies  Review of patient's allergies indicates no known allergies.  Home Medications   Prior to Admission medications   Not on File   BP 136/85 mmHg  Pulse 78  Temp(Src) 98.5 F (36.9 C) (Oral)  Resp 17  Ht  (1.753 m)  Wt 155 lb (70.308 kg)  BMI 22.88 kg/m2  SpO2 98% Physical Exam  Constitutional: He is oriented to person, place, and time. He appears well-developed and well-nourished. No distress.  HENT:  Head: Normocephalic and atraumatic.  Mouth/Throat: Oropharynx is clear and moist.  Eyes: Conjunctivae and EOM are normal. Pupils are equal, round, and reactive to light.  Neck: Normal range of motion. Neck supple. No tracheal deviation present.  Cardiovascular: Normal rate.   Pulmonary/Chest: Effort normal and breath sounds normal. No respiratory distress. He has no wheezes. He has no rales.  Abdominal: Soft. Bowel sounds are normal. There is no tenderness. There is no rebound and no guarding.  Genitourinary: Rectum normal and prostate normal.  Normal rectal tone, normal perianal sensation, internal hemmorrhoid, normal prostate.  Chaperone present  Musculoskeletal: Normal range of motion.  Paraspinal right tenderness  Neurological: He is alert and oriented to person, place, and time. He has normal reflexes. He displays normal reflexes. He exhibits normal muscle tone.  L5 S1 intact  Skin: Skin is warm and dry.  Psychiatric: He has a normal mood and affect. His behavior is normal.  Nursing note and vitals  reviewed.   ED Course  Procedures  DIAGNOSTIC STUDIES: Oxygen Saturation is 98% on RA, normal by my interpretation.    COORDINATION OF CARE: 11:39 PM - Discussed plans to perform a rectal exam. Pt advised of plan for treatment and pt agrees.  Labs Review Labs Reviewed - No data to display  Imaging Review No results found.   EKG Interpretation None      MDM   Final diagnoses:  None   Medications  ketorolac (TORADOL) injection 60 mg (60 mg Intramuscular Given 10/10/14 0053)  predniSONE (DELTASONE) tablet 60 mg (60 mg Oral Given 10/10/14 0048)  methocarbamol (ROBAXIN) tablet 1,000 mg (1,000 mg Oral Given 10/10/14 0048)     Patient's chart is marked for merge was seen a few days ago by Dr. Read Drivers and given dilaudid and rx for percocet and this is not helping.  Reportedly has appointment with Dr. Lovell Sheehan due to back issues. Will add mobic and robaxin to patient's home regimen.  Follow up with Dr. Lovell Sheehan   I personally performed the services described in this documentation, which was scribed in my presence. The recorded information has been reviewed and is accurate.    Cy Blamer, MD 10/10/14 [phone]

## 2014-10-10 ENCOUNTER — Encounter (HOSPITAL_BASED_OUTPATIENT_CLINIC_OR_DEPARTMENT_OTHER): Payer: Self-pay | Admitting: Emergency Medicine

## 2014-10-10 LAB — URINALYSIS, ROUTINE W REFLEX MICROSCOPIC
Bilirubin Urine: NEGATIVE
Glucose, UA: NEGATIVE mg/dL
Hgb urine dipstick: NEGATIVE
Ketones, ur: NEGATIVE mg/dL
LEUKOCYTES UA: NEGATIVE
Nitrite: NEGATIVE
PROTEIN: NEGATIVE mg/dL
SPECIFIC GRAVITY, URINE: 1.029 (ref 1.005–1.030)
Urobilinogen, UA: 0.2 mg/dL (ref 0.0–1.0)
pH: 5.5 (ref 5.0–8.0)

## 2014-10-10 LAB — RAPID URINE DRUG SCREEN, HOSP PERFORMED
AMPHETAMINES: NOT DETECTED
BENZODIAZEPINES: NOT DETECTED
Barbiturates: NOT DETECTED
Cocaine: NOT DETECTED
Opiates: NOT DETECTED
Tetrahydrocannabinol: POSITIVE — AB

## 2014-10-10 MED ORDER — KETOROLAC TROMETHAMINE 60 MG/2ML IM SOLN
60.0000 mg | Freq: Once | INTRAMUSCULAR | Status: AC
  Administered 2014-10-10: 60 mg via INTRAMUSCULAR
  Filled 2014-10-10: qty 2

## 2014-10-10 MED ORDER — MELOXICAM 15 MG PO TABS
15.0000 mg | ORAL_TABLET | Freq: Every day | ORAL | Status: DC
Start: 2014-10-10 — End: 2015-07-10

## 2014-10-10 MED ORDER — PREDNISONE 50 MG PO TABS
60.0000 mg | ORAL_TABLET | Freq: Once | ORAL | Status: AC
  Administered 2014-10-10: 60 mg via ORAL
  Filled 2014-10-10 (×2): qty 1

## 2014-10-10 MED ORDER — METHOCARBAMOL 500 MG PO TABS
1000.0000 mg | ORAL_TABLET | Freq: Once | ORAL | Status: AC
  Administered 2014-10-10: 1000 mg via ORAL
  Filled 2014-10-10: qty 2

## 2014-10-10 MED ORDER — METHOCARBAMOL 500 MG PO TABS: 500.0000 mg | ORAL_TABLET | Freq: Two times a day (BID) | ORAL | Status: DC

## 2014-10-10 NOTE — ED Notes (Signed)
(  Denies: nvd, fever, urinary sx, bleeding, weakness, tripping or falls), works 2 jobs with standing, needs work note, also mentions h/o gastric ulcers and hemorrhoids, has appt with Dr. Lovell Sheehan ([facility]).

## 2014-10-10 NOTE — ED Notes (Signed)
Denies further questions or needs, out in w/c with family

## 2014-10-10 NOTE — Discharge Instructions (Signed)

## 2014-11-30 ENCOUNTER — Encounter (HOSPITAL_BASED_OUTPATIENT_CLINIC_OR_DEPARTMENT_OTHER): Payer: Self-pay | Admitting: Emergency Medicine

## 2014-11-30 ENCOUNTER — Emergency Department (HOSPITAL_BASED_OUTPATIENT_CLINIC_OR_DEPARTMENT_OTHER)
Admission: EM | Admit: 2014-11-30 | Discharge: 2014-11-30 | Disposition: A | Discharge status: Home / Self Care | Payer: Self-pay | Attending: Emergency Medicine | Admitting: Emergency Medicine

## 2014-11-30 DIAGNOSIS — M62830 Muscle spasm of back: Secondary | ICD-10-CM | POA: Insufficient documentation

## 2014-11-30 DIAGNOSIS — Z79899 Other long term (current) drug therapy: Secondary | ICD-10-CM | POA: Insufficient documentation

## 2014-11-30 DIAGNOSIS — Z720 Tobacco use: Secondary | ICD-10-CM | POA: Insufficient documentation

## 2014-11-30 DIAGNOSIS — Z791 Long term (current) use of non-steroidal anti-inflammatories (NSAID): Secondary | ICD-10-CM | POA: Insufficient documentation

## 2014-11-30 DIAGNOSIS — Z872 Personal history of diseases of the skin and subcutaneous tissue: Secondary | ICD-10-CM | POA: Insufficient documentation

## 2014-11-30 MED ORDER — METHOCARBAMOL 500 MG PO TABS
500.0000 mg | ORAL_TABLET | Freq: Once | ORAL | Status: AC
  Administered 2014-11-30: 500 mg via ORAL
  Filled 2014-11-30: qty 1

## 2014-11-30 MED ORDER — METHOCARBAMOL 500 MG PO TABS: 500.0000 mg | ORAL_TABLET | Freq: Two times a day (BID) | ORAL | Status: DC

## 2014-11-30 NOTE — ED Provider Notes (Signed)
CSN: [id number]     Arrival date & time 11/30/14  0034 History   First MD Initiated Contact with Patient 11/30/14 0158     Chief Complaint  Patient presents with  . Back Pain     (Consider location/radiation/quality/duration/timing/severity/associated sxs/prior Treatment) Patient is a 29 y.o. male presenting with back pain. The history is provided by the patient.  Back Pain Location:  Sacro-iliac joint Quality:  Aching Radiates to:  Does not radiate Pain severity:  Unable to specify Pain is:  Unable to specify Onset quality:  Unable to specify Timing:  Unable to specify Progression:  Unable to specify Chronicity:  Chronic Context: not falling   Relieved by:  Nothing Worsened by:  Nothing tried Ineffective treatments:  None tried Associated symptoms: no bladder incontinence, no bowel incontinence, no fever, no leg pain, no numbness, no paresthesias, no pelvic pain, no perianal numbness, no tingling and no weakness   Risk factors: no hx of osteoporosis     Past Medical History  Diagnosis Date  . Ulcer of abdomen wall    Past Surgical History  Procedure Laterality Date  . Tonsillectomy     History reviewed. No pertinent family history. Social History  Substance Use Topics  . Smoking status: Current Every Day Smoker -- 0.50 packs/day    Types: Cigarettes  . Smokeless tobacco: None  . Alcohol Use: No    Review of Systems  Constitutional: Negative for fever.  Gastrointestinal: Negative for bowel incontinence.  Genitourinary: Negative for bladder incontinence and pelvic pain.  Musculoskeletal: Positive for back pain.  Neurological: Negative for tingling, syncope, weakness, numbness and paresthesias.  All other systems reviewed and are negative.     Allergies  Review of patient's allergies indicates no known allergies.  Home Medications   Prior to Admission medications   Medication Sig Start Date End Date Taking? Authorizing Provider  meloxicam (MOBIC) 15 MG  tablet Take 1 tablet (15 mg total) by mouth daily. 10/10/14   Clarrissa Shimkus, MD  methocarbamol (ROBAXIN) 500 MG tablet Take 1 tablet (500 mg total) by mouth 2 (two) times daily. 10/10/14   Leovanni Bjorkman, MD   BP 137/75 mmHg  Pulse 75  Temp(Src) 98.2 F (36.8 C) (Oral)  Resp 16  Ht 5\' 9"  (1.753 m)  Wt 165 lb (74.844 kg)  BMI 24.36 kg/m2  SpO2 100% Physical Exam  Constitutional: He appears well-developed and well-nourished. No distress.  Sleeping soundly does not want to wake up to talk to examiner.  Rolls over and pulls up covers  HENT:  Head: Normocephalic and atraumatic.  Right Ear: External ear normal.  Left Ear: External ear normal.  Eyes: Conjunctivae and EOM are normal.  Neck: Normal range of motion. Neck supple.  Cardiovascular: Normal rate and regular rhythm.   Pulmonary/Chest: Effort normal and breath sounds normal. No respiratory distress. He has no wheezes. He has no rales.  Abdominal: Soft. Bowel sounds are normal. There is no tenderness. There is no rebound and no guarding.  Musculoskeletal: He exhibits no edema or tenderness.  Intact l5/s1 no crepitance nor point tenderness of the c t or lspine  Neurological: He has normal reflexes.  Skin: Skin is warm and dry.    ED Course  Procedures (including critical care time) Labs Review Labs Reviewed - No data to display  Imaging Review No results found. I have personally reviewed and evaluated these images and lab results as part of my medical decision-making.   EKG Interpretation None  MDM   Final diagnoses:  None    Robaxin and close follow up.  Highly doubt pain as patient is sleeping     Zurich Carreno, MD 11/30/14 [phone]

## 2014-11-30 NOTE — ED Notes (Addendum)
C/o "pain only", (denies: urinary sx, constipation, nvd, fever, bleeding, sob, falling or tripping, dizziness or other sx). Has mentioned in the past scheduled appts with Dr. Evette Cristal.

## 2014-11-30 NOTE — Discharge Instructions (Signed)

## 2014-11-30 NOTE — ED Notes (Signed)
Dr. Palumbo at [facility] 

## 2014-11-30 NOTE — ED Notes (Signed)
Pt resting, NAD, calm, passively interactive, minimal responses, admits to back pain for months, no meds PTA, hydrocodone and another medicine usually help, has run out of med. States, "tissue in lower back is wearing down, now bone on bone". When asked if he has seen ortho or neuro, he states, "I can't get a hold of them".

## 2014-11-30 NOTE — ED Notes (Signed)
Pt states, (when asked if he has any family here with him) "wife is being seen too, for her ankle".

## 2014-11-30 NOTE — ED Notes (Signed)
Patient states that he has had back back pain x almost one year. Was seen here recently for same, but the MD that he was supposed to see "does not call me back and now I ran out of pain meds"

## 2014-12-09 ENCOUNTER — Encounter (HOSPITAL_BASED_OUTPATIENT_CLINIC_OR_DEPARTMENT_OTHER): Payer: Self-pay | Admitting: Emergency Medicine

## 2015-07-10 ENCOUNTER — Emergency Department (HOSPITAL_BASED_OUTPATIENT_CLINIC_OR_DEPARTMENT_OTHER): Payer: Self-pay

## 2015-07-10 ENCOUNTER — Encounter (HOSPITAL_BASED_OUTPATIENT_CLINIC_OR_DEPARTMENT_OTHER): Payer: Self-pay | Admitting: *Deleted

## 2015-07-10 ENCOUNTER — Emergency Department (HOSPITAL_BASED_OUTPATIENT_CLINIC_OR_DEPARTMENT_OTHER)
Admission: EM | Admit: 2015-07-10 | Discharge: 2015-07-10 | Disposition: A | Discharge status: Home / Self Care | Payer: Self-pay | Attending: Emergency Medicine | Admitting: Emergency Medicine

## 2015-07-10 DIAGNOSIS — Z8719 Personal history of other diseases of the digestive system: Secondary | ICD-10-CM | POA: Insufficient documentation

## 2015-07-10 DIAGNOSIS — Z872 Personal history of diseases of the skin and subcutaneous tissue: Secondary | ICD-10-CM | POA: Insufficient documentation

## 2015-07-10 DIAGNOSIS — R1084 Generalized abdominal pain: Secondary | ICD-10-CM | POA: Insufficient documentation

## 2015-07-10 DIAGNOSIS — F17210 Nicotine dependence, cigarettes, uncomplicated: Secondary | ICD-10-CM | POA: Insufficient documentation

## 2015-07-10 DIAGNOSIS — R1110 Vomiting, unspecified: Secondary | ICD-10-CM | POA: Insufficient documentation

## 2015-07-10 DIAGNOSIS — M5126 Other intervertebral disc displacement, lumbar region: Secondary | ICD-10-CM | POA: Insufficient documentation

## 2015-07-10 DIAGNOSIS — R109 Unspecified abdominal pain: Secondary | ICD-10-CM

## 2015-07-10 DIAGNOSIS — H578 Other specified disorders of eye and adnexa: Secondary | ICD-10-CM | POA: Insufficient documentation

## 2015-07-10 DIAGNOSIS — R197 Diarrhea, unspecified: Secondary | ICD-10-CM | POA: Insufficient documentation

## 2015-07-10 DIAGNOSIS — H18891 Other specified disorders of cornea, right eye: Secondary | ICD-10-CM

## 2015-07-10 LAB — COMPREHENSIVE METABOLIC PANEL
ALBUMIN: 4.1 g/dL (ref 3.5–5.0)
ALK PHOS: 64 U/L (ref 38–126)
ALT: 27 U/L (ref 17–63)
ANION GAP: 5 (ref 5–15)
AST: 24 U/L (ref 15–41)
BUN: 17 mg/dL (ref 6–20)
CALCIUM: 9.4 mg/dL (ref 8.9–10.3)
CHLORIDE: 110 mmol/L (ref 101–111)
CO2: 26 mmol/L (ref 22–32)
Creatinine, Ser: 1.01 mg/dL (ref 0.61–1.24)
GFR calc non Af Amer: >60 mL/min (ref >60)
GLUCOSE: 115 mg/dL — AB (ref 65–99)
POTASSIUM: 3.7 mmol/L (ref 3.5–5.1)
SODIUM: 141 mmol/L (ref 135–145)
Total Bilirubin: 0.4 mg/dL (ref 0.3–1.2)
Total Protein: 6.8 g/dL (ref 6.5–8.1)

## 2015-07-10 LAB — CBC WITH DIFFERENTIAL/PLATELET
Basophils Absolute: 0 10*3/uL (ref 0.0–0.1)
Basophils Relative: 1 %
Eosinophils Absolute: 0.2 10*3/uL (ref 0.0–0.7)
Eosinophils Relative: 3 %
HEMATOCRIT: 39.1 % (ref 39.0–52.0)
HEMOGLOBIN: 13.5 g/dL (ref 13.0–17.0)
LYMPHS ABS: 2.3 10*3/uL (ref 0.7–4.0)
LYMPHS PCT: 36 %
MCH: 29.2 pg (ref 26.0–34.0)
MCHC: 34.5 g/dL (ref 30.0–36.0)
MCV: 84.6 fL (ref 78.0–100.0)
MONOS PCT: 7 %
Monocytes Absolute: 0.4 10*3/uL (ref 0.1–1.0)
NEUTROS ABS: 3.4 10*3/uL (ref 1.7–7.7)
NEUTROS PCT: 53 %
Platelets: 271 10*3/uL (ref 150–400)
RBC: 4.62 MIL/uL (ref 4.22–5.81)
RDW: 12.6 % (ref 11.5–15.5)
WBC: 6.4 10*3/uL (ref 4.0–10.5)

## 2015-07-10 LAB — URINALYSIS, ROUTINE W REFLEX MICROSCOPIC
BILIRUBIN URINE: NEGATIVE
Glucose, UA: NEGATIVE mg/dL
HGB URINE DIPSTICK: NEGATIVE
Ketones, ur: NEGATIVE mg/dL
Leukocytes, UA: NEGATIVE
Nitrite: NEGATIVE
PH: 6 (ref 5.0–8.0)
Protein, ur: NEGATIVE mg/dL
Specific Gravity, Urine: 1.022 (ref 1.005–1.030)

## 2015-07-10 LAB — LIPASE, BLOOD: Lipase: 88 U/L — ABNORMAL HIGH (ref 11–51)

## 2015-07-10 MED ORDER — FLUORESCEIN SODIUM 1 MG OP STRP
1.0000 | ORAL_STRIP | Freq: Once | OPHTHALMIC | Status: AC
  Administered 2015-07-10: 1 via OPHTHALMIC
  Filled 2015-07-10: qty 1

## 2015-07-10 MED ORDER — ONDANSETRON HCL 4 MG/2ML IJ SOLN
4.0000 mg | Freq: Once | INTRAMUSCULAR | Status: AC
  Administered 2015-07-10: 4 mg via INTRAVENOUS
  Filled 2015-07-10: qty 2

## 2015-07-10 MED ORDER — POLYMYXIN B-TRIMETHOPRIM 10000-0.1 UNIT/ML-% OP SOLN: 1.0000 [drp] | OPHTHALMIC | Status: DC

## 2015-07-10 MED ORDER — TETRACAINE HCL 0.5 % OP SOLN
2.0000 [drp] | Freq: Once | OPHTHALMIC | Status: AC
  Administered 2015-07-10: 2 [drp] via OPHTHALMIC
  Filled 2015-07-10: qty 4

## 2015-07-10 MED ORDER — PROMETHAZINE HCL 25 MG PO TABS: 25.0000 mg | ORAL_TABLET | Freq: Four times a day (QID) | ORAL | Status: DC | PRN

## 2015-07-10 MED ORDER — DICYCLOMINE HCL 20 MG PO TABS: 20.0000 mg | ORAL_TABLET | Freq: Two times a day (BID) | ORAL | Status: DC | PRN

## 2015-07-10 MED ORDER — HYDROMORPHONE HCL 1 MG/ML IJ SOLN
0.5000 mg | Freq: Once | INTRAMUSCULAR | Status: AC
  Administered 2015-07-10: 0.5 mg via INTRAVENOUS
  Filled 2015-07-10: qty 1

## 2015-07-10 MED ORDER — SODIUM CHLORIDE 0.9 % IV SOLN
INTRAVENOUS | Status: DC
  Administered 2015-07-10: 09:00:00 via INTRAVENOUS

## 2015-07-10 MED ORDER — SODIUM CHLORIDE 0.9 % IV BOLUS (SEPSIS)
1000.0000 mL | Freq: Once | INTRAVENOUS | Status: AC
  Administered 2015-07-10: 1000 mL via INTRAVENOUS

## 2015-07-10 MED ORDER — IOPAMIDOL (ISOVUE-300) INJECTION 61%
100.0000 mL | Freq: Once | INTRAVENOUS | Status: AC | PRN
  Administered 2015-07-10: 100 mL via INTRAVENOUS

## 2015-07-10 NOTE — ED Notes (Signed)
NS to [facility] for right eye irrigation.

## 2015-07-10 NOTE — ED Notes (Signed)
C/o cramping in mid abd since 0200 this am. C/o n/v/d. C/o redness to right eye and Game with pressure.

## 2015-07-10 NOTE — ED Notes (Signed)
NS thru [facility] infused. Pt states eye feels better.

## 2015-07-10 NOTE — Discharge Instructions (Signed)

## 2015-07-10 NOTE — ED Provider Notes (Signed)
CSN: [id number]     Arrival date & time 07/10/15  0721 History   First MD Initiated Contact with Patient 07/10/15 [phone]     Chief complaint: Abdominal pain with vomiting and diarrhea   HPI Patient woke up early this morning about 2 AM with mid abdominal pain. It was a sharp stabbing pain. Following that, he began having episodes of vomiting and diarrhea. Patient has had at least 3 episodes of both vomiting and diarrhea. He denies any blood in his stools or his emesis. Whenever he tries to eat or drink anything seems to pass right through him. The pain now continues to be sharp and cramping all over his abdomen.  He denies any recent travel. No recent antibiotics. No history of chronic abdominal issues.  Patient also woke up this morning with irritation and discomfort of his right eye. The pain is burning. He has not noticed any drainage but his eye does look red. Patient does wear contact lenses but did not wear them this morning. Past Medical History  Diagnosis Date  . Multiple gastric ulcers   . Ulcer of abdomen wall [facility](HCC)    Past Surgical History  Procedure Laterality Date  . Tonsillectomy     No family history on file. Social History  Substance Use Topics  . Smoking status: Current Every Day Smoker -- 0.50 packs/day    Types: Cigarettes  . Smokeless tobacco: None  . Alcohol Use: No    Review of Systems  All other systems reviewed and are negative.     Allergies  Review of patient's allergies indicates no known allergies.  Home Medications   Prior to Admission medications   Medication Sig Start Date End Date Taking? Authorizing Provider  dicyclomine (BENTYL) 20 MG tablet Take 1 tablet (20 mg total) by mouth 2 (two) times daily as needed for spasms. 07/10/15   Linwood DibblesJon Cloyd Ragas, MD  promethazine (PHENERGAN) 25 MG tablet Take 1 tablet (25 mg total) by mouth every 6 (six) hours as needed for nausea or vomiting. 07/10/15   Linwood DibblesJon Whitleigh Garramone, MD  trimethoprim-polymyxin b (POLYTRIM) ophthalmic  solution Place 1 drop into the right eye every 4 (four) hours. 07/10/15   Linwood DibblesJon Tyon Cerasoli, MD   BP 137/88 mmHg  Pulse 58  Temp(Src) 98.3 F (36.8 C) (Oral)  Resp 18  Ht 5\' 9"  (1.753 m)  Wt 75.014 kg  BMI 24.41 kg/m2  SpO2 100% Physical Exam  Constitutional: He appears well-developed and well-nourished. No distress.  HENT:  Head: Normocephalic and atraumatic.  Right Ear: External ear normal.  Left Ear: External ear normal.  Eyes: EOM are normal. Pupils are equal, round, and reactive to light. Right eye exhibits no discharge and no exudate. Left eye exhibits no discharge and no exudate. Right conjunctiva is injected. Right conjunctiva has no hemorrhage. Left conjunctiva is not injected. Left conjunctiva has no hemorrhage. No scleral icterus.  Slit lamp exam:      The right eye shows no corneal abrasion, no corneal flare, no corneal ulcer, no fluorescein uptake and no anterior chamber bulge.  Neck: Neck supple. No tracheal deviation present.  Cardiovascular: Normal rate, regular rhythm and intact distal pulses.   Pulmonary/Chest: Effort normal and breath sounds normal. No stridor. No respiratory distress. He has no wheezes. He has no rales.  Abdominal: Soft. Bowel sounds are normal. He exhibits no distension and no mass. There is generalized tenderness. There is no rebound and no guarding. No hernia.  Musculoskeletal: He exhibits no edema or tenderness.  Neurological: He is alert. He has normal strength. No cranial nerve deficit (no facial droop, extraocular movements intact, no slurred speech) or sensory deficit. He exhibits normal muscle tone. He displays no seizure activity. Coordination normal.  Skin: Skin is warm and dry. No rash noted.  Psychiatric: He has a normal mood and affect.  Nursing note and vitals reviewed.   ED Course  Procedures  Medications  sodium chloride 0.9 % bolus 1,000 mL (0 mLs Intravenous Stopped 07/10/15 0900)    And  0.9 %  sodium chloride infusion ( Intravenous  New Bag/Given 07/10/15 0906)  fluorescein ophthalmic strip 1 strip (1 strip Right Eye Given 07/10/15 0759)  tetracaine (PONTOCAINE) 0.5 % ophthalmic solution 2 drop (2 drops Right Eye Given 07/10/15 0802)  HYDROmorphone (DILAUDID) injection 0.5 mg (0.5 mg Intravenous Given 07/10/15 0802)  ondansetron (ZOFRAN) injection 4 mg (4 mg Intravenous Given 07/10/15 0800)  HYDROmorphone (DILAUDID) injection 0.5 mg (0.5 mg Intravenous Given 07/10/15 0937)  iopamidol (ISOVUE-300) 61 % injection 100 mL (100 mLs Intravenous Contrast Given 07/10/15 1030)    Labs Review Labs Reviewed  COMPREHENSIVE METABOLIC PANEL - Abnormal; Notable for the following:    Glucose, Bld 115 (*)    All other components within normal limits  LIPASE, BLOOD - Abnormal; Notable for the following:    Lipase 88 (*)    All other components within normal limits  CBC WITH DIFFERENTIAL/PLATELET  URINALYSIS, ROUTINE W REFLEX MICROSCOPIC (NOT AT [facility])    Imaging Review Ct Abdomen Pelvis W Contrast  07/10/2015  CLINICAL DATA:  Abdominal pain with nausea and vomiting beginning this morning. Elevated lipase. EXAM: CT ABDOMEN AND PELVIS WITH CONTRAST TECHNIQUE: Multidetector CT imaging of the abdomen and pelvis was performed using the standard protocol following bolus administration of intravenous contrast. CONTRAST:  ISOVUE-300 IOPAMIDOL (ISOVUE-300) INJECTION 61% COMPARISON:  None. FINDINGS: Lower chest:  Normal. Hepatobiliary: Slight hepatomegaly. No focal lesions. Biliary tree is normal. Pancreas: Normal. Spleen: Normal. Adrenals/Urinary Tract: Normal. Stomach/Bowel: Normal. Vascular/Lymphatic: Normal. Reproductive: Normal. Other: No free air or free fluid. Musculoskeletal: There are a relatively prominent central disc protrusions at L3-4 and to a greater degree at L4-5 slightly asymmetric to the right. Otherwise normal. IMPRESSION: Prominent disc protrusions at L3-4 and L4-5. Otherwise benign appearing abdomen and pelvis. Specifically, the  pancreas appears normal. Electronically Signed   By: Francene Boyers M.D.   On: 07/10/2015 11:10   I have personally reviewed and evaluated these images and lab results as part of my medical decision-making.   MDM   Final diagnoses:  Vomiting and diarrhea  Abdominal pain, unspecified abdominal location  Corneal irritation of right eye  Herniated lumbar disc without myelopathy    0930 Pt is still having pain.  Lipase is slightly elevated.  Will US abdomen to evaluate for possible gallbladder issues.   1133  CT scan is unremarkable. Does have a history of prior back problems according to his medical records.  Symptoms are most likely related to a viral gastroenteritis.  Regarding his eye, there is no ulceration or abrasion noted on exam. His eye was irrigated while he was in the emergency room. His symptoms may be related to irritation from his contact lenses. I will have him use antibiotic eyedrops and follow up with an eye doctor    Linwood Dibbles, MD 07/10/15 1134

## 2015-07-13 ENCOUNTER — Encounter (HOSPITAL_BASED_OUTPATIENT_CLINIC_OR_DEPARTMENT_OTHER): Payer: Self-pay | Admitting: Emergency Medicine

## 2015-07-13 DIAGNOSIS — R112 Nausea with vomiting, unspecified: Secondary | ICD-10-CM | POA: Insufficient documentation

## 2015-07-13 DIAGNOSIS — F17210 Nicotine dependence, cigarettes, uncomplicated: Secondary | ICD-10-CM | POA: Insufficient documentation

## 2015-07-13 DIAGNOSIS — R1013 Epigastric pain: Secondary | ICD-10-CM | POA: Insufficient documentation

## 2015-07-13 NOTE — ED Notes (Signed)
Patient states that he is having abdominal pain since last Friday

## 2015-07-14 ENCOUNTER — Emergency Department (HOSPITAL_BASED_OUTPATIENT_CLINIC_OR_DEPARTMENT_OTHER)
Admission: EM | Admit: 2015-07-14 | Discharge: 2015-07-14 | Disposition: A | Discharge status: Home / Self Care | Payer: Self-pay | Attending: Emergency Medicine | Admitting: Emergency Medicine

## 2015-07-14 ENCOUNTER — Ambulatory Visit (HOSPITAL_BASED_OUTPATIENT_CLINIC_OR_DEPARTMENT_OTHER)
Admission: RE | Admit: 2015-07-14 | Discharge: 2015-07-14 | Disposition: A | Discharge status: Home / Self Care | Payer: Self-pay | Source: Ambulatory Visit | Attending: Emergency Medicine | Admitting: Emergency Medicine

## 2015-07-14 ENCOUNTER — Other Ambulatory Visit (HOSPITAL_BASED_OUTPATIENT_CLINIC_OR_DEPARTMENT_OTHER): Payer: Self-pay | Admitting: Emergency Medicine

## 2015-07-14 DIAGNOSIS — R1013 Epigastric pain: Secondary | ICD-10-CM

## 2015-07-14 DIAGNOSIS — R112 Nausea with vomiting, unspecified: Secondary | ICD-10-CM

## 2015-07-14 LAB — COMPREHENSIVE METABOLIC PANEL
ALBUMIN: 3.8 g/dL (ref 3.5–5.0)
ALT: 22 U/L (ref 17–63)
AST: 19 U/L (ref 15–41)
Alkaline Phosphatase: 54 U/L (ref 38–126)
Anion gap: 8 (ref 5–15)
BUN: 19 mg/dL (ref 6–20)
CHLORIDE: 109 mmol/L (ref 101–111)
CO2: 24 mmol/L (ref 22–32)
CREATININE: 1.18 mg/dL (ref 0.61–1.24)
Calcium: 9 mg/dL (ref 8.9–10.3)
GFR calc non Af Amer: >60 mL/min (ref >60)
GLUCOSE: 94 mg/dL (ref 65–99)
Potassium: 3.6 mmol/L (ref 3.5–5.1)
SODIUM: 141 mmol/L (ref 135–145)
Total Bilirubin: 0.4 mg/dL (ref 0.3–1.2)
Total Protein: 6.5 g/dL (ref 6.5–8.1)

## 2015-07-14 LAB — CBC WITH DIFFERENTIAL/PLATELET
BASOS ABS: 0 10*3/uL (ref 0.0–0.1)
BASOS PCT: 1 %
EOS ABS: 0.3 10*3/uL (ref 0.0–0.7)
Eosinophils Relative: 4 %
HCT: 37.8 % — ABNORMAL LOW (ref 39.0–52.0)
HEMOGLOBIN: 12.8 g/dL — AB (ref 13.0–17.0)
Lymphocytes Relative: 48 %
Lymphs Abs: 3.3 10*3/uL (ref 0.7–4.0)
MCH: 29.3 pg (ref 26.0–34.0)
MCHC: 33.9 g/dL (ref 30.0–36.0)
MCV: 86.5 fL (ref 78.0–100.0)
Monocytes Absolute: 0.6 10*3/uL (ref 0.1–1.0)
Monocytes Relative: 9 %
NEUTROS PCT: 38 %
Neutro Abs: 2.5 10*3/uL (ref 1.7–7.7)
Platelets: 278 10*3/uL (ref 150–400)
RBC: 4.37 MIL/uL (ref 4.22–5.81)
RDW: 12.4 % (ref 11.5–15.5)
WBC: 6.7 10*3/uL (ref 4.0–10.5)

## 2015-07-14 LAB — LIPASE, BLOOD: Lipase: 30 U/L (ref 11–51)

## 2015-07-14 MED ORDER — SODIUM CHLORIDE 0.9 % IV BOLUS (SEPSIS)
1000.0000 mL | Freq: Once | INTRAVENOUS | Status: AC
  Administered 2015-07-14: 1000 mL via INTRAVENOUS

## 2015-07-14 MED ORDER — PROMETHAZINE HCL 25 MG PO TABS: 25.0000 mg | ORAL_TABLET | Freq: Four times a day (QID) | ORAL | Status: DC | PRN

## 2015-07-14 MED ORDER — ONDANSETRON HCL 4 MG/2ML IJ SOLN
4.0000 mg | Freq: Once | INTRAMUSCULAR | Status: AC
  Administered 2015-07-14: 4 mg via INTRAVENOUS

## 2015-07-14 MED ORDER — KETOROLAC TROMETHAMINE 30 MG/ML IJ SOLN
INTRAMUSCULAR | Status: AC
  Filled 2015-07-14: qty 1

## 2015-07-14 MED ORDER — ONDANSETRON HCL 4 MG/2ML IJ SOLN
INTRAMUSCULAR | Status: AC
  Filled 2015-07-14: qty 2

## 2015-07-14 MED ORDER — KETOROLAC TROMETHAMINE 30 MG/ML IJ SOLN
30.0000 mg | Freq: Once | INTRAMUSCULAR | Status: AC
  Administered 2015-07-14: 30 mg via INTRAVENOUS

## 2015-07-14 MED ORDER — ONDANSETRON 8 MG PO TBDP: ORAL_TABLET | ORAL | Status: DC

## 2015-07-14 NOTE — ED Notes (Signed)
Pt given 2nd can of sprite per pt request.

## 2015-07-14 NOTE — ED Notes (Signed)
MD at bedside. 

## 2015-07-14 NOTE — ED Provider Notes (Signed)
CSN: [id number]     Arrival date & time 07/13/15  2208 History  By signing my name below, I, Linus Galas, attest that this documentation has been prepared under the direction and in the presence of Geoffery Lyons, MD. Electronically Signed: Linus Galas, ED Scribe. 07/14/2015. 12:48 AM.   Chief Complaint  Patient presents with  . Abdominal Pain   The history is provided by the patient. No language interpreter was used.   HPI Comments: Angus Amini is a 30 y.o. male who presents to the Emergency Department with has a PMHx of gastric ulcers complaining of abdominal pain that began 3 days ago. Pt also reports vomiting and diarrhea. He states his pain radiates to his back. Pt denies any fevers, chills, SOB, or any other symptoms at this time.   As per chart review, pt was seen on 07/10/15. He had an unremarkable CT abdomen and a slightly elevated Lipase  Past Medical History  Diagnosis Date  . Multiple gastric ulcers   . Ulcer of abdomen wall [facility])    Past Surgical History  Procedure Laterality Date  . Tonsillectomy     History reviewed. No pertinent family history. Social History  Substance Use Topics  . Smoking status: Current Every Day Smoker -- 0.50 packs/day    Types: Cigarettes  . Smokeless tobacco: None  . Alcohol Use: No    Review of Systems A complete 10 system review of systems was obtained and all systems are negative except as noted in the HPI and PMH.   Allergies  Review of patient's allergies indicates no known allergies.  Home Medications   Prior to Admission medications   Medication Sig Start Date End Date Taking? Authorizing Provider  dicyclomine (BENTYL) 20 MG tablet Take 1 tablet (20 mg total) by mouth 2 (two) times daily as needed for spasms. 07/10/15   Linwood Dibbles, MD  promethazine (PHENERGAN) 25 MG tablet Take 1 tablet (25 mg total) by mouth every 6 (six) hours as needed for nausea or vomiting. 07/10/15   Linwood Dibbles, MD  trimethoprim-polymyxin b  (POLYTRIM) ophthalmic solution Place 1 drop into the right eye every 4 (four) hours. 07/10/15   Linwood Dibbles, MD   BP 152/95 mmHg  Pulse 72  Temp(Src) 98.1 F (36.7 C) (Oral)  Resp 18  Ht  (1.753 m)  Wt 168 lb (76.204 kg)  BMI 24.80 kg/m2  SpO2 100%   Physical Exam  Constitutional: He is oriented to person, place, and time. He appears well-developed and well-nourished.  HENT:  Head: Normocephalic and atraumatic.  Mouth/Throat: Oropharynx is clear and moist.  Eyes: EOM are normal.  Neck: Normal range of motion.  Cardiovascular: Normal rate, regular rhythm, normal heart sounds and intact distal pulses.   Pulmonary/Chest: Effort normal and breath sounds normal. No respiratory distress. He has no wheezes. He has no rales.  Abdominal: Soft. He exhibits no distension. There is tenderness. There is no rebound and no guarding.  TTP in the epigastric region.   Musculoskeletal: Normal range of motion.  Neurological: He is alert and oriented to person, place, and time.  Skin: Skin is warm and dry.  Psychiatric: He has a normal mood and affect. Judgment normal.  Nursing note and vitals reviewed.   ED Course  Procedures  DIAGNOSTIC STUDIES: Oxygen Saturation is 100% on room air, normal by my interpretation.    COORDINATION OF CARE: 12:40 AM Will give fluids. Discussed treatment plan with pt at bedside and pt agreed to plan.  Labs  Review Labs Reviewed - No data to display  Imaging Review No results found. I have personally reviewed and evaluated these images and lab results as part of my medical decision-making.    MDM   Final diagnoses:  None    Patient presents with complaints of nausea, vomiting, and abdominal cramping. He was seen here 3 days ago and had a negative CT scan. He presents again with ongoing discomfort. He has no fever or white count and his exam is reassuring. He was hydrated and treated with Toradol and Zofran. When I returned to the room to check on him and  discussed the results of his tests, he was sound asleep and had to be shaken to be awakened. He then informed me that his pain medication didn't work. I see nothing tonight that appears emergent. The physician that had seen him 3 days ago mentioned the possibility of his gallbladder being the culprit. He will return tomorrow for an ultrasound to rule out this possibility, although I seriously doubt this to be the source.  I personally performed the services described in this documentation, which was scribed in my presence. The recorded information has been reviewed and is accurate.       Geoffery Lyonsouglas Tenishia Ekman, MD 07/14/15 [phone]

## 2015-07-14 NOTE — ED Notes (Signed)
Pt given 3rd can of sprite. No vomiting since arrival to ED.

## 2015-09-24 ENCOUNTER — Encounter (HOSPITAL_BASED_OUTPATIENT_CLINIC_OR_DEPARTMENT_OTHER): Payer: Self-pay | Admitting: *Deleted

## 2015-09-24 ENCOUNTER — Emergency Department (HOSPITAL_BASED_OUTPATIENT_CLINIC_OR_DEPARTMENT_OTHER)
Admission: EM | Admit: 2015-09-24 | Discharge: 2015-09-25 | Disposition: A | Discharge status: Home / Self Care | Payer: Self-pay | Attending: Emergency Medicine | Admitting: Emergency Medicine

## 2015-09-24 DIAGNOSIS — F17210 Nicotine dependence, cigarettes, uncomplicated: Secondary | ICD-10-CM | POA: Insufficient documentation

## 2015-09-24 DIAGNOSIS — Y939 Activity, unspecified: Secondary | ICD-10-CM | POA: Insufficient documentation

## 2015-09-24 DIAGNOSIS — M25561 Pain in right knee: Secondary | ICD-10-CM | POA: Insufficient documentation

## 2015-09-24 DIAGNOSIS — Y999 Unspecified external cause status: Secondary | ICD-10-CM | POA: Insufficient documentation

## 2015-09-24 DIAGNOSIS — Y92410 Unspecified street and highway as the place of occurrence of the external cause: Secondary | ICD-10-CM | POA: Insufficient documentation

## 2015-09-24 NOTE — ED Notes (Signed)
Pt c/o right knee pain x 3 days.  

## 2015-09-25 ENCOUNTER — Encounter (HOSPITAL_BASED_OUTPATIENT_CLINIC_OR_DEPARTMENT_OTHER): Payer: Self-pay | Admitting: Emergency Medicine

## 2015-09-25 MED ORDER — NAPROXEN 250 MG PO TABS
500.0000 mg | ORAL_TABLET | Freq: Once | ORAL | Status: AC
  Administered 2015-09-25: 500 mg via ORAL
  Filled 2015-09-25: qty 2

## 2015-09-25 MED ORDER — NAPROXEN 500 MG PO TABS: ORAL_TABLET | ORAL | Status: DC
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
## 2016-11-15 ENCOUNTER — Emergency Department (HOSPITAL_BASED_OUTPATIENT_CLINIC_OR_DEPARTMENT_OTHER)
Admission: EM | Admit: 2016-11-15 | Discharge: 2016-11-15 | Disposition: A | Discharge status: Home / Self Care | Payer: Self-pay | Attending: Emergency Medicine | Admitting: Emergency Medicine

## 2016-11-15 ENCOUNTER — Encounter (HOSPITAL_BASED_OUTPATIENT_CLINIC_OR_DEPARTMENT_OTHER): Payer: Self-pay | Admitting: Emergency Medicine

## 2016-11-15 ENCOUNTER — Emergency Department (HOSPITAL_BASED_OUTPATIENT_CLINIC_OR_DEPARTMENT_OTHER): Payer: Self-pay

## 2016-11-15 DIAGNOSIS — F17210 Nicotine dependence, cigarettes, uncomplicated: Secondary | ICD-10-CM | POA: Insufficient documentation

## 2016-11-15 DIAGNOSIS — R0789 Other chest pain: Secondary | ICD-10-CM | POA: Insufficient documentation

## 2016-11-15 LAB — CBC WITH DIFFERENTIAL/PLATELET
BASOS ABS: 0 10*3/uL (ref 0.0–0.1)
Basophils Relative: 1 %
Eosinophils Absolute: 0.2 10*3/uL (ref 0.0–0.7)
Eosinophils Relative: 3 %
HEMATOCRIT: 38.4 % — AB (ref 39.0–52.0)
Hemoglobin: 12.9 g/dL — ABNORMAL LOW (ref 13.0–17.0)
LYMPHS PCT: 38 %
Lymphs Abs: 2.4 10*3/uL (ref 0.7–4.0)
MCH: 28.5 pg (ref 26.0–34.0)
MCHC: 33.6 g/dL (ref 30.0–36.0)
MCV: 85 fL (ref 78.0–100.0)
MONO ABS: 0.4 10*3/uL (ref 0.1–1.0)
Monocytes Relative: 6 %
NEUTROS ABS: 3.3 10*3/uL (ref 1.7–7.7)
Neutrophils Relative %: 52 %
Platelets: 285 10*3/uL (ref 150–400)
RBC: 4.52 MIL/uL (ref 4.22–5.81)
RDW: 12.8 % (ref 11.5–15.5)
WBC: 6.3 10*3/uL (ref 4.0–10.5)

## 2016-11-15 LAB — TROPONIN I: Troponin I: <0.03 ng/mL (ref <0.03)

## 2016-11-15 LAB — COMPREHENSIVE METABOLIC PANEL
ALT: 21 U/L (ref 17–63)
AST: 17 U/L (ref 15–41)
Albumin: 4.2 g/dL (ref 3.5–5.0)
Alkaline Phosphatase: 58 U/L (ref 38–126)
Anion gap: 6 (ref 5–15)
BILIRUBIN TOTAL: 0.3 mg/dL (ref 0.3–1.2)
BUN: 15 mg/dL (ref 6–20)
CALCIUM: 9.1 mg/dL (ref 8.9–10.3)
CO2: 26 mmol/L (ref 22–32)
CREATININE: 1.13 mg/dL (ref 0.61–1.24)
Chloride: 107 mmol/L (ref 101–111)
GFR calc Af Amer: >60 mL/min (ref >60)
Glucose, Bld: 97 mg/dL (ref 65–99)
POTASSIUM: 3.7 mmol/L (ref 3.5–5.1)
Sodium: 139 mmol/L (ref 135–145)
TOTAL PROTEIN: 7 g/dL (ref 6.5–8.1)

## 2016-11-15 LAB — LIPASE, BLOOD: Lipase: 50 U/L (ref 11–51)

## 2016-11-15 MED ORDER — NAPROXEN 500 MG PO TABS: ORAL_TABLET | ORAL | 0 refills | Status: AC

## 2016-11-15 MED ORDER — ASPIRIN 325 MG PO TABS
325.0000 mg | ORAL_TABLET | Freq: Once | ORAL | Status: AC
  Administered 2016-11-15: 325 mg via ORAL
  Filled 2016-11-15: qty 1

## 2016-11-15 MED ORDER — GI COCKTAIL ~~LOC~~
30.0000 mL | Freq: Once | ORAL | Status: AC
  Administered 2016-11-15: 30 mL via ORAL
  Filled 2016-11-15: qty 30

## 2016-11-15 NOTE — ED Notes (Signed)
Pt on monitor 

## 2016-11-15 NOTE — ED Triage Notes (Signed)
Intermittent L side chest pain x 2 days. Non radiating, denies SOB, N/V.

## 2016-11-15 NOTE — ED Provider Notes (Signed)
[facility] Provider Note   CSN: [id number] Arrival date & time: 11/15/16  7829     History   Chief Complaint Chief Complaint  Patient presents with  . Chest Pain    HPI Dillon Nelson is a 31 y.o. male history of reflux here presenting with chest pain. Patient has intermittent left-sided chest pain for the last 2 days. Patient states that he thought was reflux initially and took some Gas-X with minimal relief. He was working last night and the pain got worse. He denies any exertional component to the pain or any radiations to the pain. Denies any arm pain or tingling or numbness. He denies any shortness of breath or leg pain or leg swelling. Patient has no personal history of  CAD but several family members had heart attacks 30s to 40s. No history of blood clots. He is here because he was concerned that he may have a heart attack. Denies any fevers or chills or cough.   The history is provided by the patient.    Past Medical History:  Diagnosis Date  . Multiple gastric ulcers   . Ulcer of abdomen wall (HCC)     There are no active problems to display for this patient.   Past Surgical History:  Procedure Laterality Date  . TONSILLECTOMY         Home Medications    Prior to Admission medications   Medication Sig Start Date End Date Taking? Authorizing Provider  naproxen (NAPROSYN) 500 MG tablet Take one tablet twice daily as needed for knee pain. 09/25/15   Molpus, John, MD    Family History No family history on file.  Social History Social History  Substance Use Topics  . Smoking status: Current Every Day Smoker    Packs/day: 0.50    Types: Cigarettes  . Smokeless tobacco: Never Used  . Alcohol use No     Allergies   Patient has no known allergies.   Review of Systems Review of Systems  Cardiovascular: Positive for chest pain.  All other systems reviewed and are negative.    Physical Exam Updated Vital Signs BP 127/88   Pulse 70    Temp 98.3 F (36.8 C) (Oral)   Resp 20   Ht 5\' 10"  (1.778 m)   Wt 73.9 kg (163 lb)   SpO2 100%   BMI 23.39 kg/m   Physical Exam  Constitutional: He is oriented to person, place, and time. He appears well-developed and well-nourished.  Anxious   HENT:  Head: Normocephalic.  Mouth/Throat: Oropharynx is clear and moist.  Eyes: Pupils are equal, round, and reactive to light. Conjunctivae and EOM are normal.  Neck: Normal range of motion. Neck supple.  Cardiovascular: Normal rate, regular rhythm and normal heart sounds.   Pulmonary/Chest: Effort normal and breath sounds normal.  ? Reproducible L chest tenderness   Abdominal: Soft. Bowel sounds are normal. He exhibits no distension. There is no tenderness.  Musculoskeletal: Normal range of motion.  Neurological: He is alert and oriented to person, place, and time.  Skin: Skin is warm.  Psychiatric: He has a normal mood and affect.  Nursing note and vitals reviewed.    ED Treatments / Results  Labs (all labs ordered are listed, but only abnormal results are displayed) Labs Reviewed  CBC WITH DIFFERENTIAL/PLATELET - Abnormal; Notable for the following:       Result Value   Hemoglobin 12.9 (*)    HCT 38.4 (*)    All other components  within normal limits  COMPREHENSIVE METABOLIC PANEL  TROPONIN I  LIPASE, BLOOD  TROPONIN I    EKG  EKG Interpretation  Date/Time:  Tuesday November 15 2016 08:24:15 EDT Ventricular Rate:  68 PR Interval:    QRS Duration: 105 QT Interval:  409 QTC Calculation: 435 R Axis:   38 Text Interpretation:  Sinus rhythm No significant change since last tracing Confirmed by Richardean Canal [phone]) on 11/15/2016 8:36:47 AM       Radiology Dg Chest 2 View  Result Date: 11/15/2016 CLINICAL DATA:  Chest pain recently, smoking history EXAM: CHEST  2 VIEW COMPARISON:  None. FINDINGS: No active infiltrate or effusion is seen. Mediastinal and hilar contours are unremarkable. The heart is within normal limits  in size. No bony abnormality is seen. IMPRESSION: No active cardiopulmonary disease. Electronically Signed   By: Dwyane Dee M.D.   On: 11/15/2016 08:58    Procedures Procedures (including critical care time)  Medications Ordered in ED Medications  aspirin tablet 325 mg (325 mg Oral Given 11/15/16 0909)  gi cocktail (Maalox,Lidocaine,Donnatal) (30 mLs Oral Given 11/15/16 0909)     Initial Impression / Assessment and Plan / ED Course  I have reviewed the triage vital signs and the nursing notes.  Pertinent labs & imaging results that were available during my care of the patient were reviewed by me and considered in my medical decision making (see chart for details).     Dillon Nelson is a 31 y.o. male here with L sided chest pain. Has family hx of CAD but no personal hx. No shortness of breath, not tachycardic or hypoxic and I doubt PE or dissection. Likely MSK pain, low risk for ACS. Will get labs, trop x 2, CXR. Will give asa, GI cocktail.   11:40 AM Pain free, comfortably sleeping. Labs and trop x 2 normal. CXR nl. Will dc home.   Final Clinical Impressions(s) / ED Diagnoses   Final diagnoses:  None    New Prescriptions New Prescriptions   No medications on file     Charlynne Pander, MD 11/15/16 1141

## 2016-11-15 NOTE — Discharge Instructions (Signed)
Take naprosyn for pain.   See your doctor.   Return to ER if you have worse chest pain, trouble breathing, abdominal pain, vomiting.

## 2017-04-17 IMAGING — US US ABDOMEN LIMITED
1 series · 14 of 25 positions shown · non-contrast
Comparison: None.

CLINICAL DATA: Generalized abdomen pain, nausea and vomiting for 1
week.

EXAM:
US ABDOMEN LIMITED - RIGHT UPPER QUADRANT

[Series 1: us abdomen limited · 0.15mm/px · 14 of 35 slices shown]
[im 1/35]
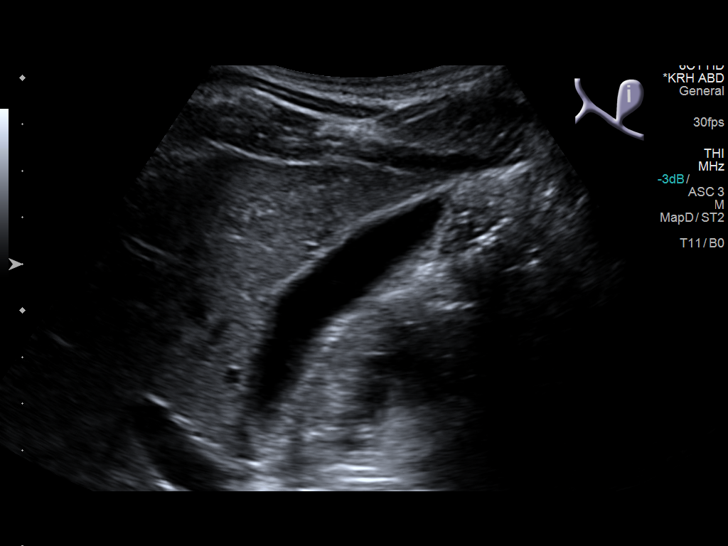
[im 3/35]
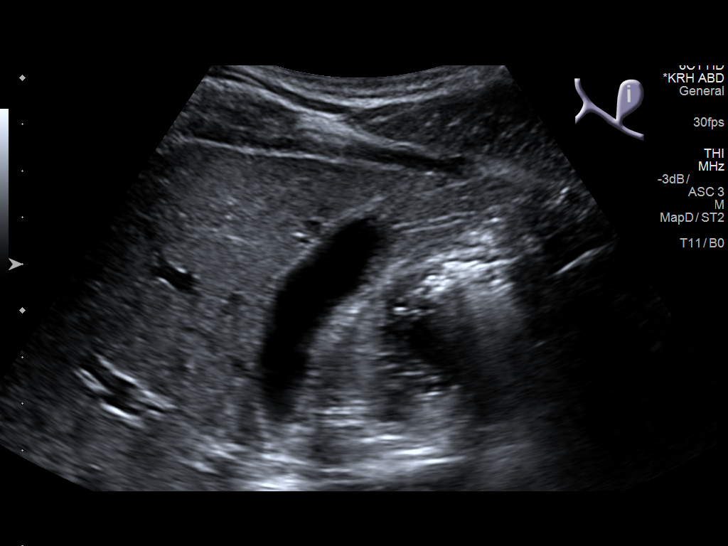
[im 6/35]
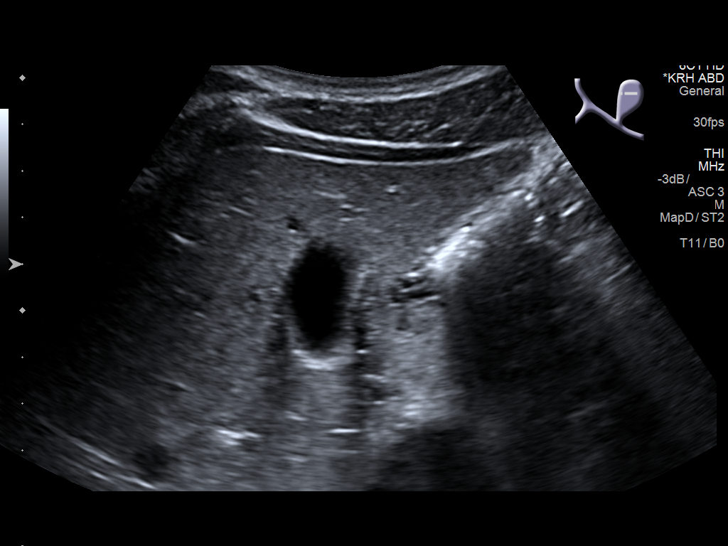
[im 9/35]
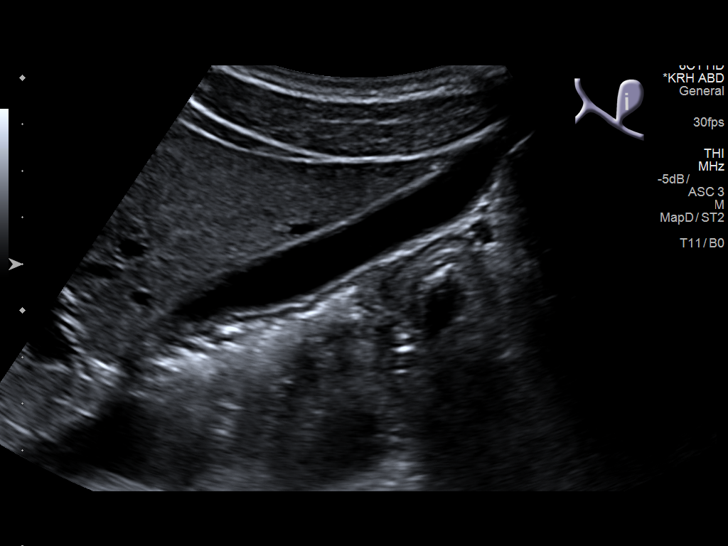
[im 12/35]
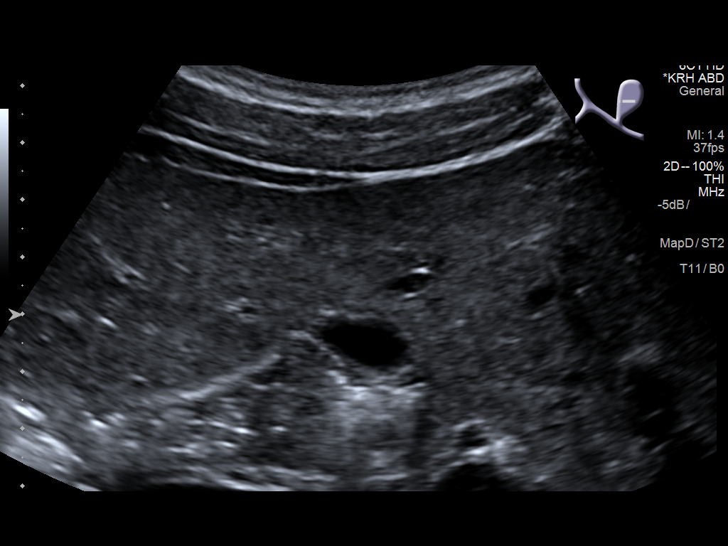
[im 13/35]
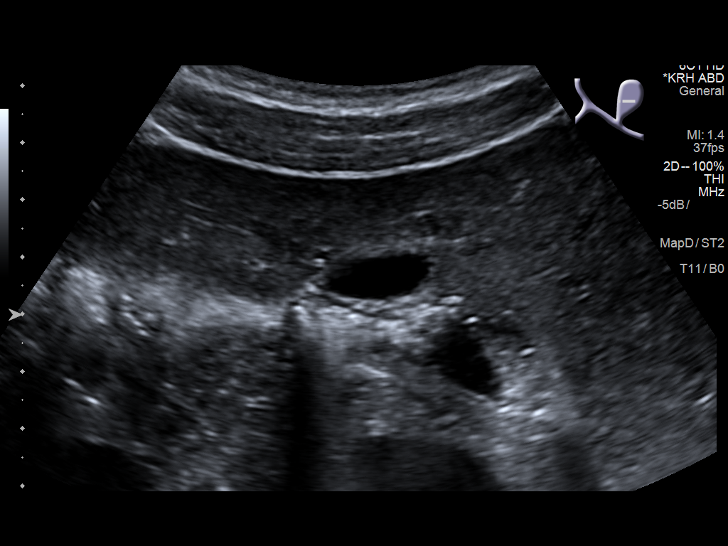
[im 16/35]
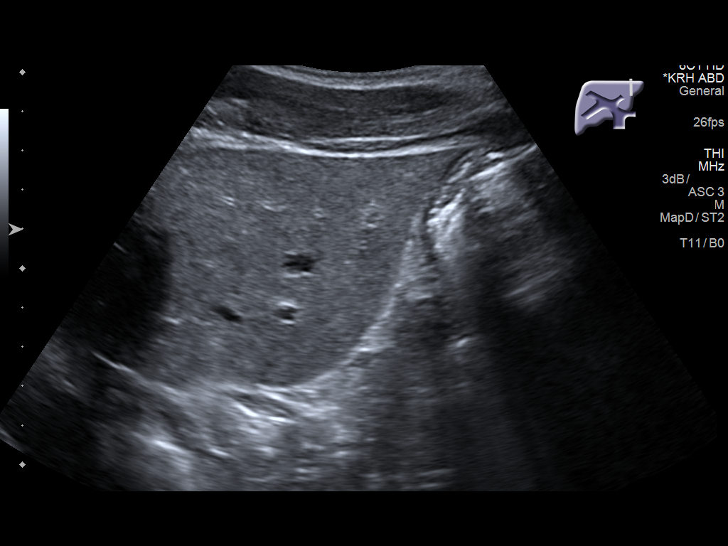
[im 19/35]
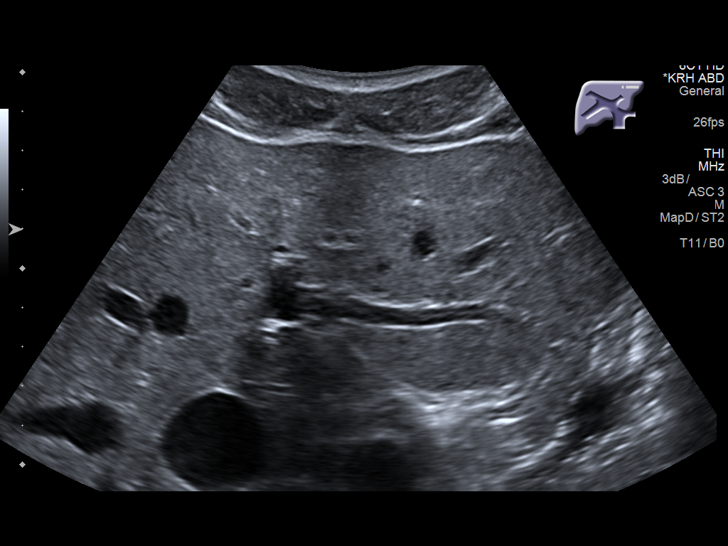
[im 22/35]
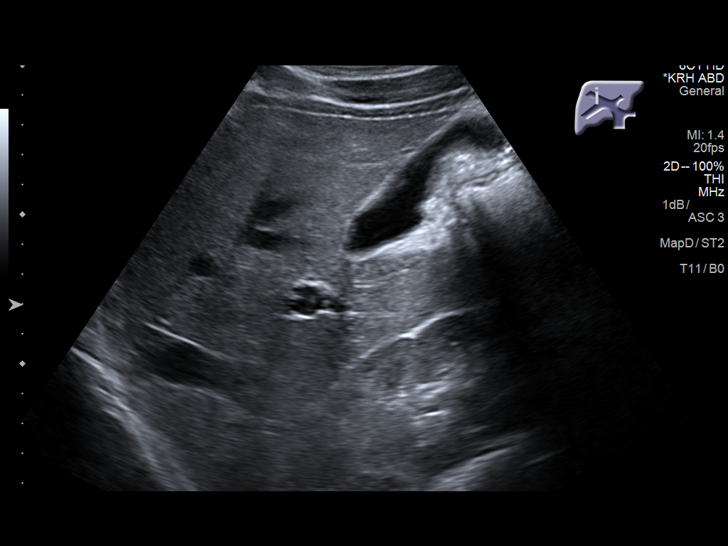
[im 23/35]
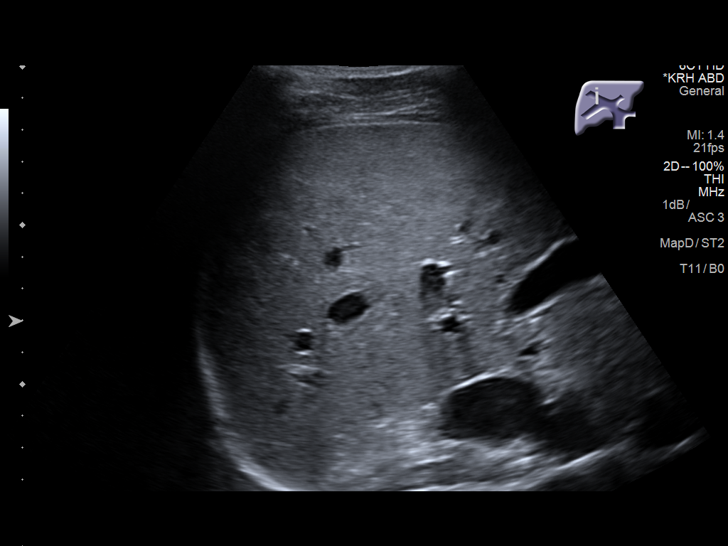
[im 26/35]
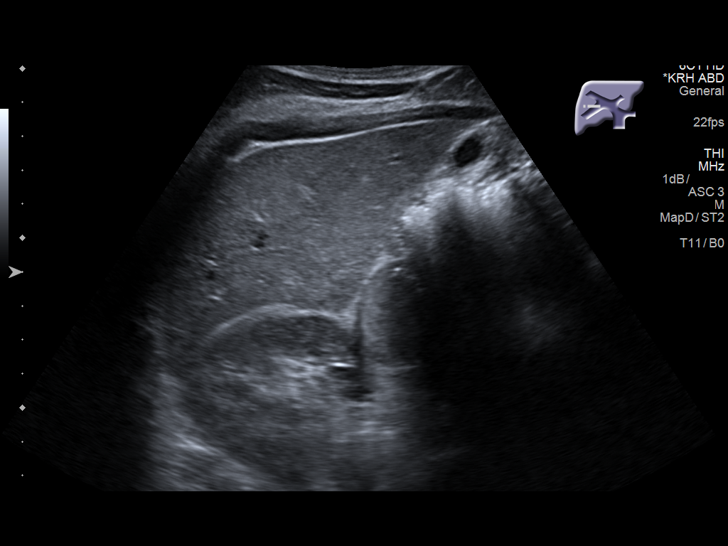
[im 29/35]
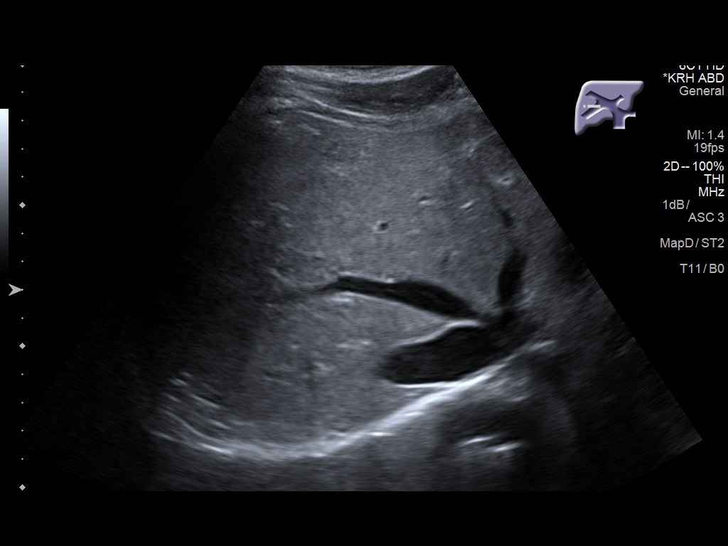
[im 32/35]
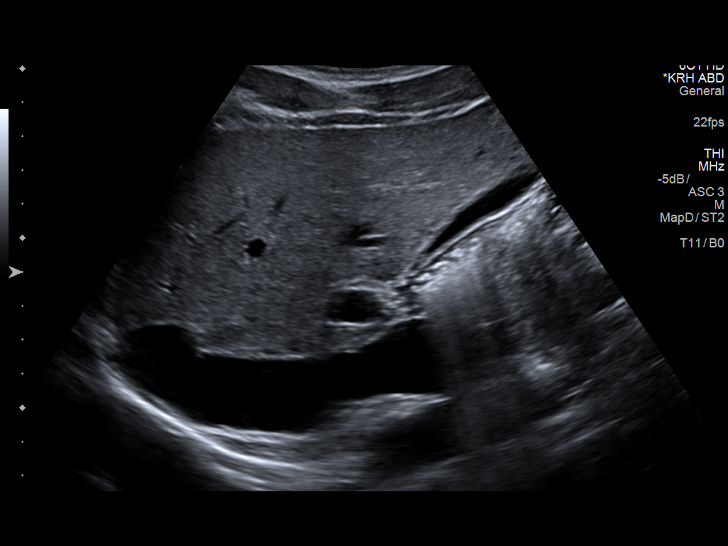
[im 35/35]
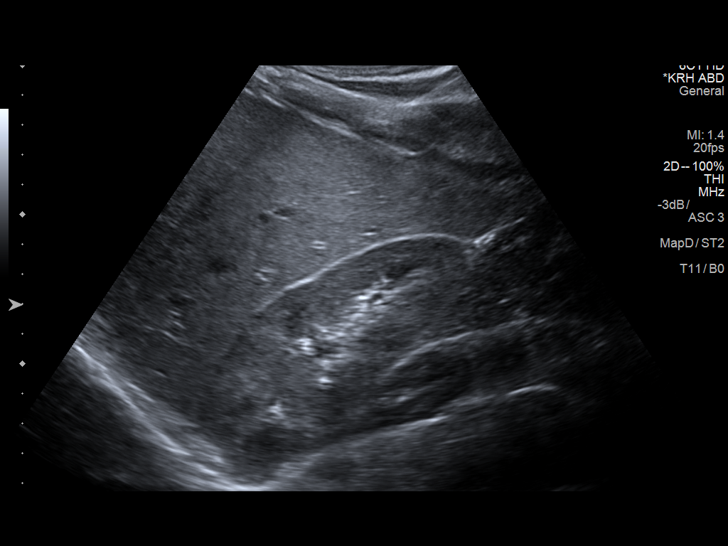

[14 of 25 positions shown; findings below may reference images not displayed]

FINDINGS: Gallbladder:

No gallstones or wall thickening visualized. No sonographic Murphy
sign noted by sonographer.

Common bile duct:

Diameter: 2 mm.

Liver:

No focal lesion identified. Within normal limits in parenchymal
echogenicity.
IMPRESSION: Normal gallbladder.  No evidence of acute cholecystitis.

## 2017-05-19 ENCOUNTER — Other Ambulatory Visit: Payer: Self-pay

## 2017-05-19 ENCOUNTER — Emergency Department (HOSPITAL_BASED_OUTPATIENT_CLINIC_OR_DEPARTMENT_OTHER)
Admission: EM | Admit: 2017-05-19 | Discharge: 2017-05-19 | Disposition: A | Discharge status: Home / Self Care | Payer: Self-pay | Attending: Emergency Medicine | Admitting: Emergency Medicine

## 2017-05-19 ENCOUNTER — Encounter (HOSPITAL_BASED_OUTPATIENT_CLINIC_OR_DEPARTMENT_OTHER): Payer: Self-pay | Admitting: Emergency Medicine

## 2017-05-19 DIAGNOSIS — F17210 Nicotine dependence, cigarettes, uncomplicated: Secondary | ICD-10-CM | POA: Insufficient documentation

## 2017-05-19 DIAGNOSIS — K644 Residual hemorrhoidal skin tags: Secondary | ICD-10-CM | POA: Insufficient documentation

## 2017-05-19 DIAGNOSIS — R030 Elevated blood-pressure reading, without diagnosis of hypertension: Secondary | ICD-10-CM | POA: Insufficient documentation

## 2017-05-19 LAB — URINALYSIS, ROUTINE W REFLEX MICROSCOPIC
Bilirubin Urine: NEGATIVE
Glucose, UA: NEGATIVE mg/dL
Hgb urine dipstick: NEGATIVE
Ketones, ur: NEGATIVE mg/dL
Leukocytes, UA: NEGATIVE
NITRITE: NEGATIVE
PH: 6 (ref 5.0–8.0)
Protein, ur: NEGATIVE mg/dL
SPECIFIC GRAVITY, URINE: 1.025 (ref 1.005–1.030)

## 2017-05-19 MED ORDER — HYDROCORTISONE 2.5 % RE CREA
TOPICAL_CREAM | Freq: Once | RECTAL | Status: DC
  Filled 2017-05-19: qty 28.35

## 2017-05-19 MED ORDER — HYDROCORTISONE 1 % EX CREA
TOPICAL_CREAM | CUTANEOUS | Status: AC
  Filled 2017-05-19: qty 56

## 2017-05-19 MED ORDER — HYDROCORTISONE ACETATE 25 MG RE SUPP
RECTAL | Status: AC
  Filled 2017-05-19: qty 1

## 2017-05-19 MED ORDER — SENNOSIDES-DOCUSATE SODIUM 8.6-50 MG PO TABS: 1.0000 | ORAL_TABLET | Freq: Every day | ORAL | 0 refills | Status: AC

## 2017-05-19 MED ORDER — NITROGLYCERIN 2 % TD OINT
1.0000 [in_us] | TOPICAL_OINTMENT | Freq: Once | TRANSDERMAL | Status: DC
  Filled 2017-05-19: qty 1

## 2017-05-19 MED ORDER — LIDOCAINE VISCOUS 2 % MT SOLN
20.0000 mL | Freq: Once | OROMUCOSAL | Status: DC
  Filled 2017-05-19: qty 30

## 2017-05-19 NOTE — ED Provider Notes (Signed)
[facility] EMERGENCY DEPARTMENT Provider Note   CSN: [id number] Arrival date & time: 05/19/17  4098     History   Chief Complaint Chief Complaint  Patient presents with  . Hemorrhoids    HPI   Blood pressure (!) 145/100, pulse 64, temperature 98.7 F (37.1 C), temperature source Oral, resp. rate 16, height 5\' 10"  (1.778 m), weight 76.2 kg (168 lb), SpO2 100 %.  Dillon Nelson is a 32 y.o. male complaining of with "pain from hemorrhoids" for the last 2 years. Patient has not seen a PCP or has visited the ED for this since he started having them. Decided to see somebody due to 8/10 sharp/shooting pain beginning to radiate anteriorly whenever he has to urinate as well as noticing light blood-tinged water after having a bowel movement. Has tried to self-treat with Preparation H cream and ointment, sitz baths, and Tylenol PM to help him sleep at night. Pain gets worse with bowel movements, standing, walking, and sweating. Pain lasts 2-3 hours following a bowel movement then resides. No pertinent abdominal or other surgeries noted. No pertinent medical history noted.   Past Medical History:  Diagnosis Date  . Multiple gastric ulcers   . Ulcer of abdomen wall (HCC)     There are no active problems to display for this patient.   Past Surgical History:  Procedure Laterality Date  . TONSILLECTOMY         Home Medications    Prior to Admission medications   Medication Sig Start Date End Date Taking? Authorizing Provider  naproxen (NAPROSYN) 500 MG tablet Take one tablet twice daily as needed for knee pain. 11/15/16   Charlynne Pander, MD  senna-docusate (SENOKOT-S) 8.6-50 MG tablet Take 1 tablet by mouth daily. 05/19/17   Falisha Osment, Joni Reining, PA-C  promethazine (PHENERGAN) 25 MG tablet Take 1 tablet (25 mg total) by mouth every 6 (six) hours as needed for nausea or vomiting. 07/14/15 09/25/15  Gwyneth Sprout, MD    Family History History reviewed. No pertinent family  history.  Social History Social History   Tobacco Use  . Smoking status: Current Every Day Smoker    Packs/day: 0.50    Types: Cigarettes  . Smokeless tobacco: Never Used  Substance Use Topics  . Alcohol use: Yes    Comment: occ  . Drug use: Yes    Types: Marijuana    Comment: occ     Allergies   Patient has no known allergies.   Review of Systems Review of Systems  A complete review of systems was obtained and all systems are negative except as noted in the HPI and PMH.   Physical Exam Updated Vital Signs BP (!) 145/100 (BP Location: Right Arm)   Pulse 64   Temp 98.7 F (37.1 C) (Oral)   Resp 16   Ht 5\' 10"  (1.778 m)   Wt 76.2 kg (168 lb)   SpO2 100%   BMI 24.11 kg/m   Physical Exam  Constitutional: He is oriented to person, place, and time. He appears well-developed and well-nourished. No distress.  HENT:  Head: Normocephalic and atraumatic.  Mouth/Throat: Oropharynx is clear and moist.  Eyes: Conjunctivae and EOM are normal. Pupils are equal, round, and reactive to light.  Neck: Normal range of motion.  Cardiovascular: Normal rate, regular rhythm and intact distal pulses.  Pulmonary/Chest: Effort normal and breath sounds normal.  Abdominal: Soft. There is no tenderness.  Genitourinary:  Genitourinary Comments: Rectal exam is chaperoned by PA student Ladona Ridgel.  Small external hemorrhoids, nonthrombosed, no fissures.  No internal exam per patient preference.  Musculoskeletal: Normal range of motion.  Neurological: He is alert and oriented to person, place, and time.  Skin: He is not diaphoretic.  Psychiatric: He has a normal mood and affect.  Nursing note and vitals reviewed.    ED Treatments / Results  Labs (all labs ordered are listed, but only abnormal results are displayed) Labs Reviewed  URINALYSIS, ROUTINE W REFLEX MICROSCOPIC    EKG  EKG Interpretation None       Radiology No results found.  Procedures Procedures (including critical  care time)  Medications Ordered in ED Medications  nitroGLYCERIN (NITROGLYN) 2 % ointment 1 inch (not administered)  lidocaine (XYLOCAINE) 2 % viscous mouth solution 20 mL (not administered)  hydrocortisone (ANUSOL-HC) 2.5 % rectal cream (not administered)  hydrocortisone (ANUSOL-HC) 25 MG suppository (not administered)  hydrocortisone cream 1 % (not administered)     Initial Impression / Assessment and Plan / ED Course  I have reviewed the triage vital signs and the nursing notes.  Pertinent labs & imaging results that were available during my care of the patient were reviewed by me and considered in my medical decision making (see chart for details).     Vitals:   05/19/17 0948 05/19/17 0949 05/19/17 1152  BP:  (!) 162/128 (!) 145/100  Pulse:  98 64  Resp:  18 16  Temp:  98.7 F (37.1 C)   TempSrc:  Oral   SpO2:  100% 100%  Weight: 76.2 kg (168 lb)    Height: 5\' 10"  (1.778 m)      Medications  nitroGLYCERIN (NITROGLYN) 2 % ointment 1 inch (not administered)  lidocaine (XYLOCAINE) 2 % viscous mouth solution 20 mL (not administered)  hydrocortisone (ANUSOL-HC) 2.5 % rectal cream (not administered)  hydrocortisone (ANUSOL-HC) 25 MG suppository (not administered)  hydrocortisone cream 1 % (not administered)    Dillon Nelson is 32 y.o. male presenting with Bothersome external hemorrhoids which she has had chronically.  I have applied a combination of nitroglycerin paste, lidocaine and hydrocortisone ointment to the area with some relief.  General surgery referral is given.Bothersome external hemorrhoids which she has had chronically.  I have applied a combination of nitroglycerin paste, lidocaine and hydrocortisone ointment to the area with some relief.  General surgery referral is given.  Reporting some discomfort at urination however her urinalysis without signs of infection.  Blood pressure is elevated in the ED, of advised him that he will need to have this rechecked,  resource guide given for this patient to establish primary care.  Evaluation does not show pathology that would require ongoing emergent intervention or inpatient treatment. Pt is hemodynamically stable and mentating appropriately. Discussed findings and plan with patient/guardian, who agrees with care plan. All questions answered. Return precautions discussed and outpatient follow up given.      Final Clinical Impressions(s) / ED Diagnoses   Final diagnoses:  External hemorrhoids without complication  Elevated blood pressure reading    ED Discharge Orders        Ordered    senna-docusate (SENOKOT-S) 8.6-50 MG tablet  Daily     05/19/17 1249       Zeb Rawl, Mardella Laymanicole, PA-C 05/19/17 1253    Charlynne PanderYao, David Hsienta, MD 05/21/17 [phone]

## 2017-05-19 NOTE — Discharge Instructions (Signed)
Please follow with your primary care doctor in the next 5 days for high blood pressure evaluation. If you do not have a primary care doctor, present to urgent care. Reduce salt intake. Seek emergency medical care for unilateral weakness, slurring, change in vision, or chest pain and shortness of breath.  Do not hesitate to return to the emergency room for any new, worsening or concerning symptoms.  Please obtain primary care using resource guide below. Let them know that you were seen in the emergency room and that they will need to obtain records for further outpatient management.

## 2017-05-19 NOTE — ED Triage Notes (Signed)
Pt c/o hemorrhoids x 1-2 months; no relief from OTC meds

## 2018-01-13 IMAGING — CT CT ABD-PELV W/ CM
2 of 4 series · 17 of 46 positions shown, 19 images · IV contrast (APPLIED)
Comparison: None.

CLINICAL DATA: Abdominal pain with nausea and vomiting beginning
this morning. Elevated lipase.

EXAM:
CT ABDOMEN AND PELVIS WITH CONTRAST
TECHNIQUE: Multidetector CT imaging of the abdomen and pelvis was performed
using the standard protocol following bolus administration of
intravenous contrast.
CONTRAST:  100mL 69QNU7-PMM IOPAMIDOL (69QNU7-PMM) INJECTION 61%

[Series 2: axial st · axial · 0.70mm/px · z∈[-476,-46]mm · 14 of 94 slices shown, 16 images]
[im 4/94  soft-tissue]
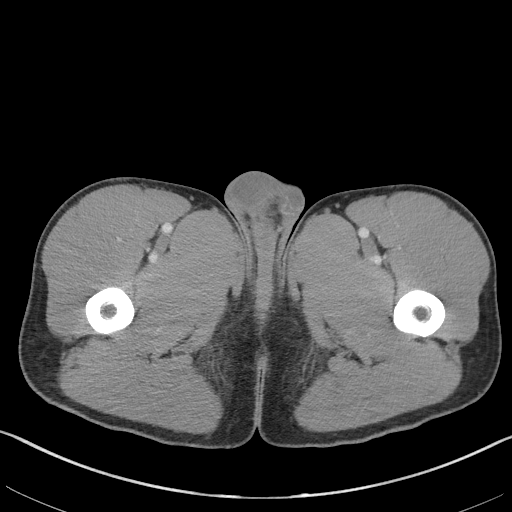
[im 4/94  bone]
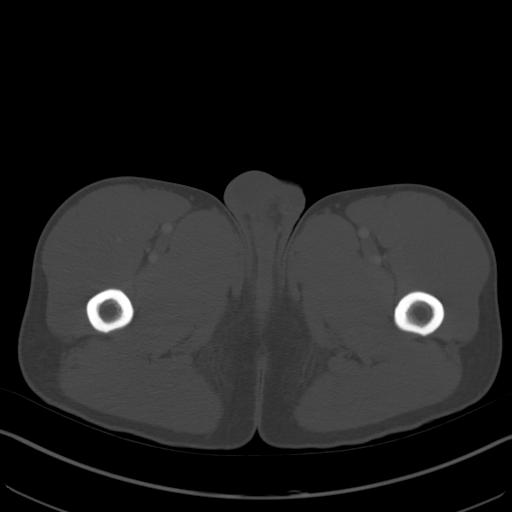
[im 11/94  soft-tissue]
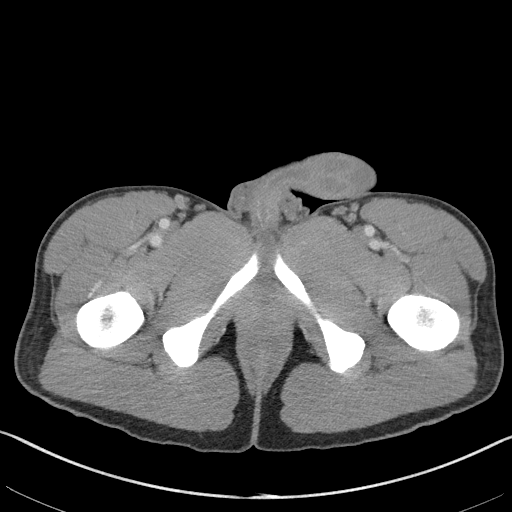
[im 18/94  soft-tissue]
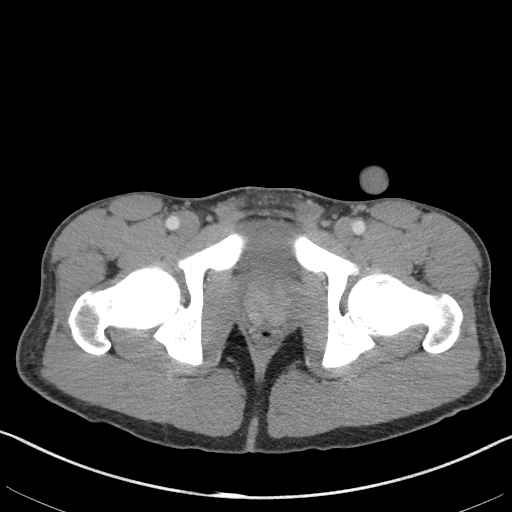
[im 26/94  soft-tissue]
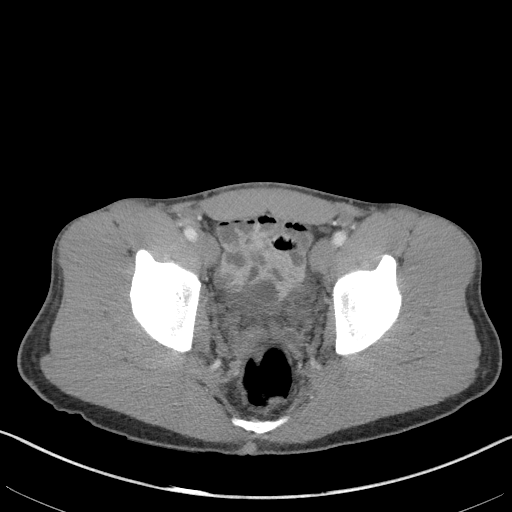
[im 33/94  soft-tissue]
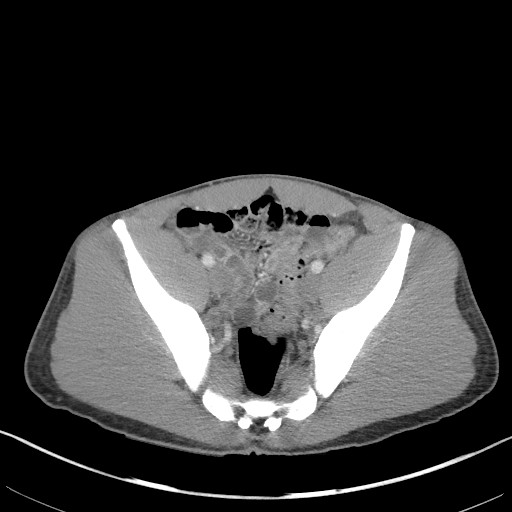
[im 36/94  soft-tissue]
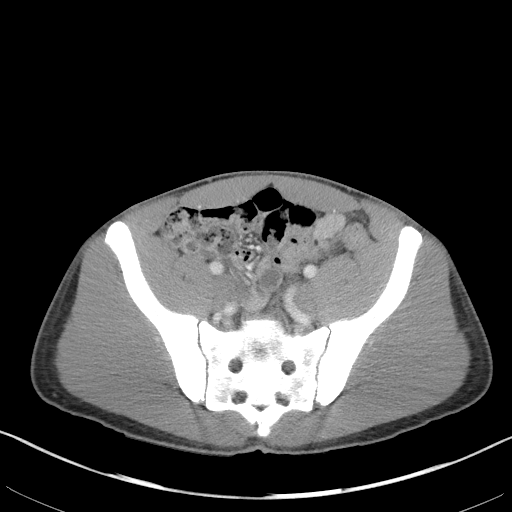
[im 43/94  soft-tissue]
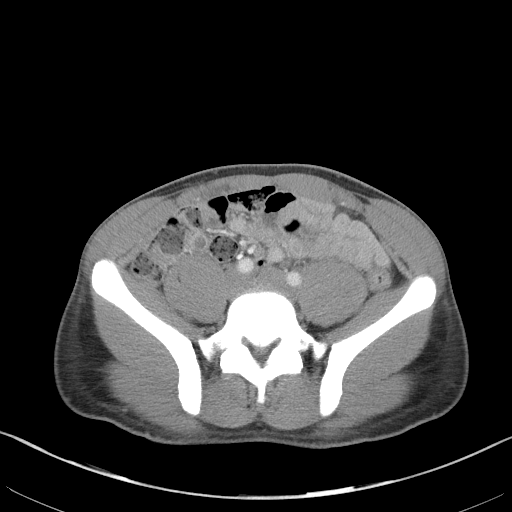
[im 51/94  soft-tissue]
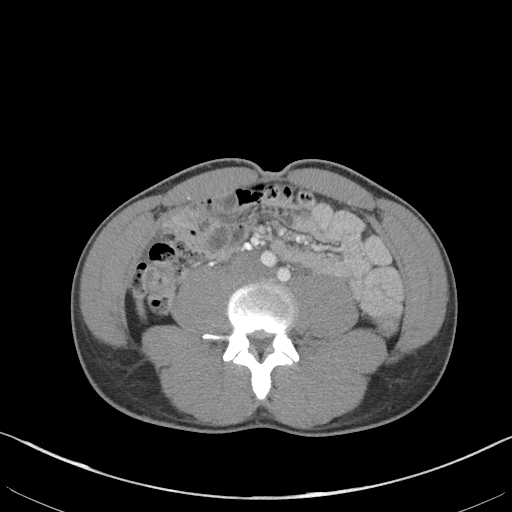
[im 58/94  soft-tissue]
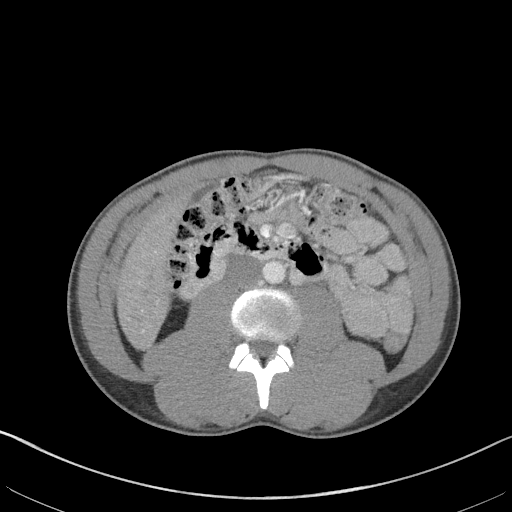
[im 58/94  bone]
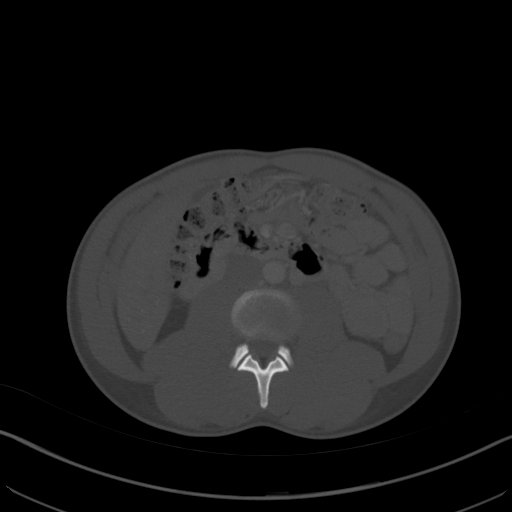
[im 61/94  soft-tissue]
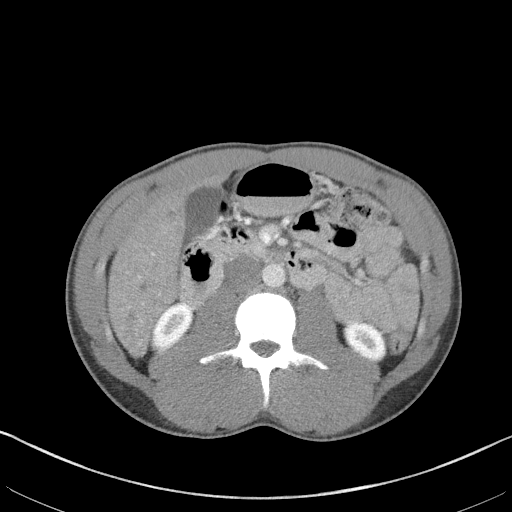
[im 68/94  soft-tissue]
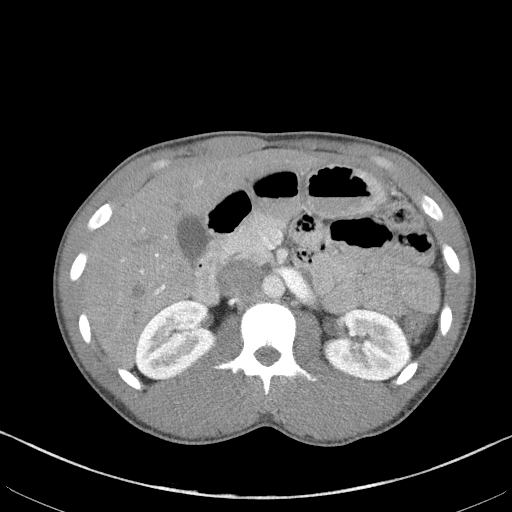
[im 76/94  soft-tissue]
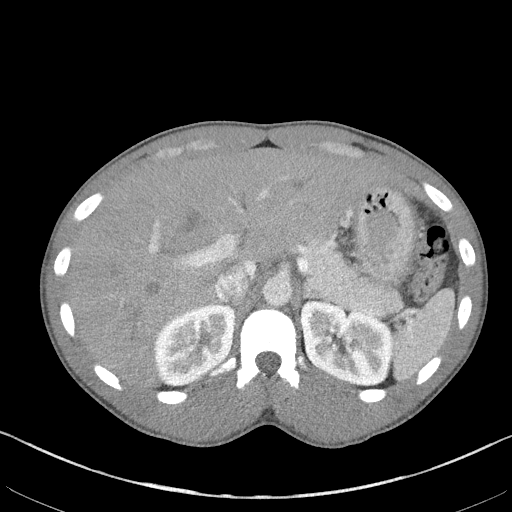
[im 83/94  soft-tissue]
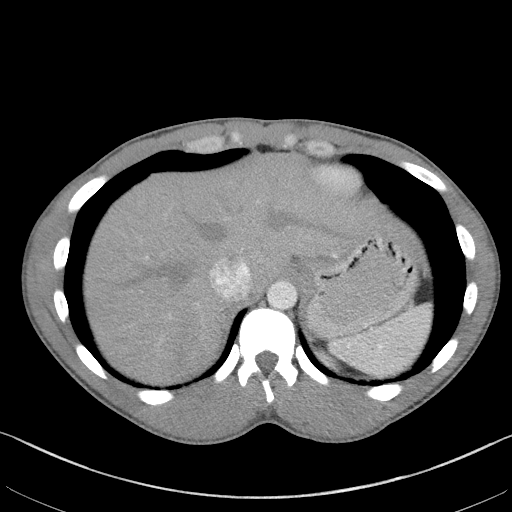
[im 90/94  soft-tissue]
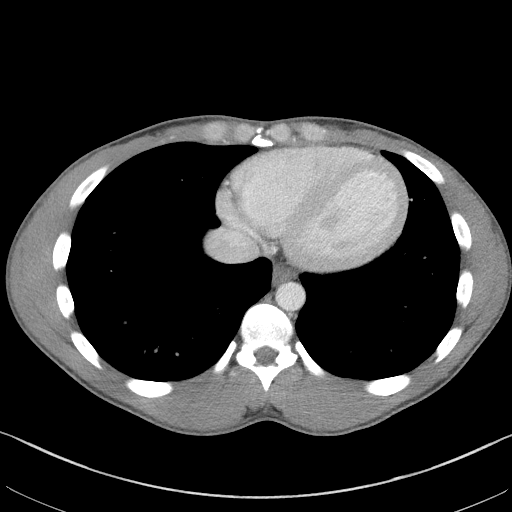

[Series 5: coronal st · coronal · 0.79mm/px · 3 of 72 slices shown]
[im 24/72  soft-tissue]
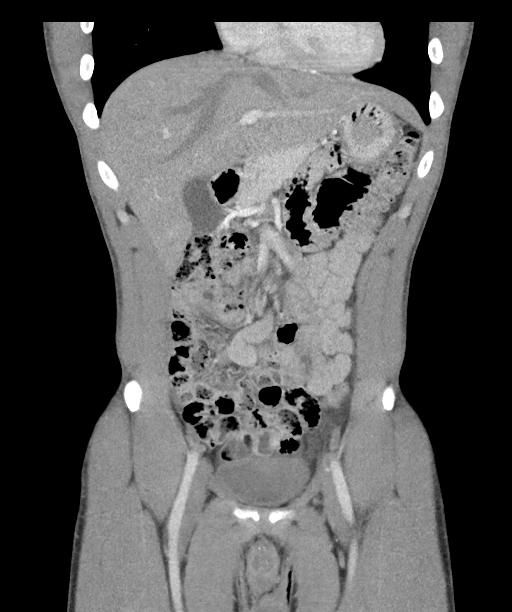
[im 32/72  soft-tissue]
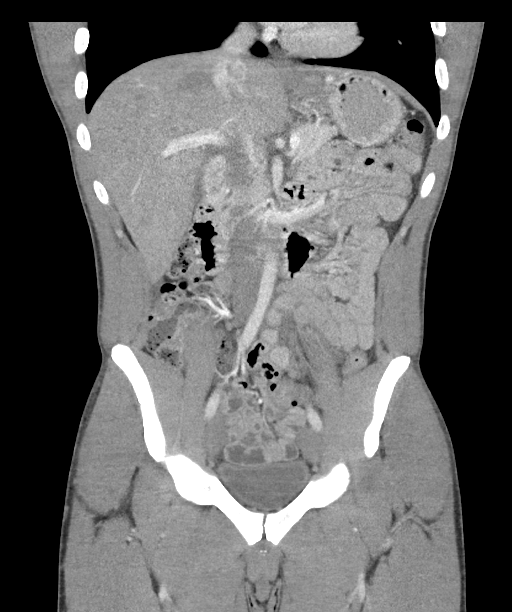
[im 40/72  soft-tissue]
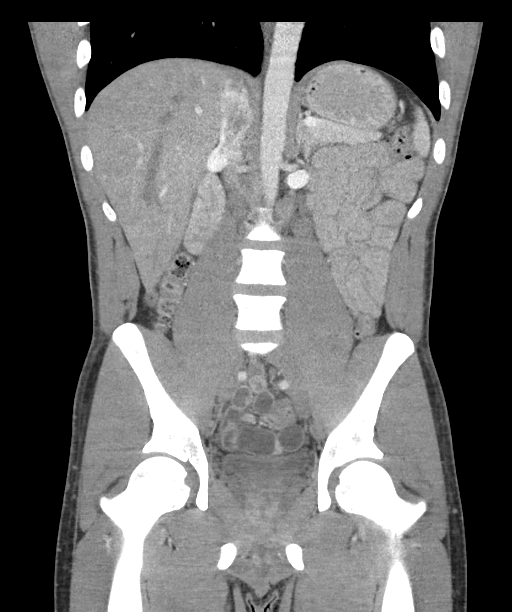

[17 of 46 positions shown; findings below may reference images not displayed]

FINDINGS: Lower chest:  Normal.

Hepatobiliary: Slight hepatomegaly. No focal lesions. Biliary tree
is normal.

Pancreas: Normal.

Spleen: Normal.

Adrenals/Urinary Tract: Normal.

Stomach/Bowel: Normal.

Vascular/Lymphatic: Normal.

Reproductive: Normal.

Other: No free air or free fluid.

Musculoskeletal: There are a relatively prominent central disc
protrusions at L3-4 and to a greater degree at L4-5 slightly
asymmetric to the right. Otherwise normal.
IMPRESSION: Prominent disc protrusions at L3-4 and L4-5. Otherwise benign
appearing abdomen and pelvis. Specifically, the pancreas appears
normal.

## 2019-05-22 IMAGING — DX DG CHEST 2V
2 series · 2 of 2 positions shown · non-contrast
Comparison: None.

CLINICAL DATA: Chest pain recently, smoking history

EXAM:
CHEST  2 VIEW

[chest pa]
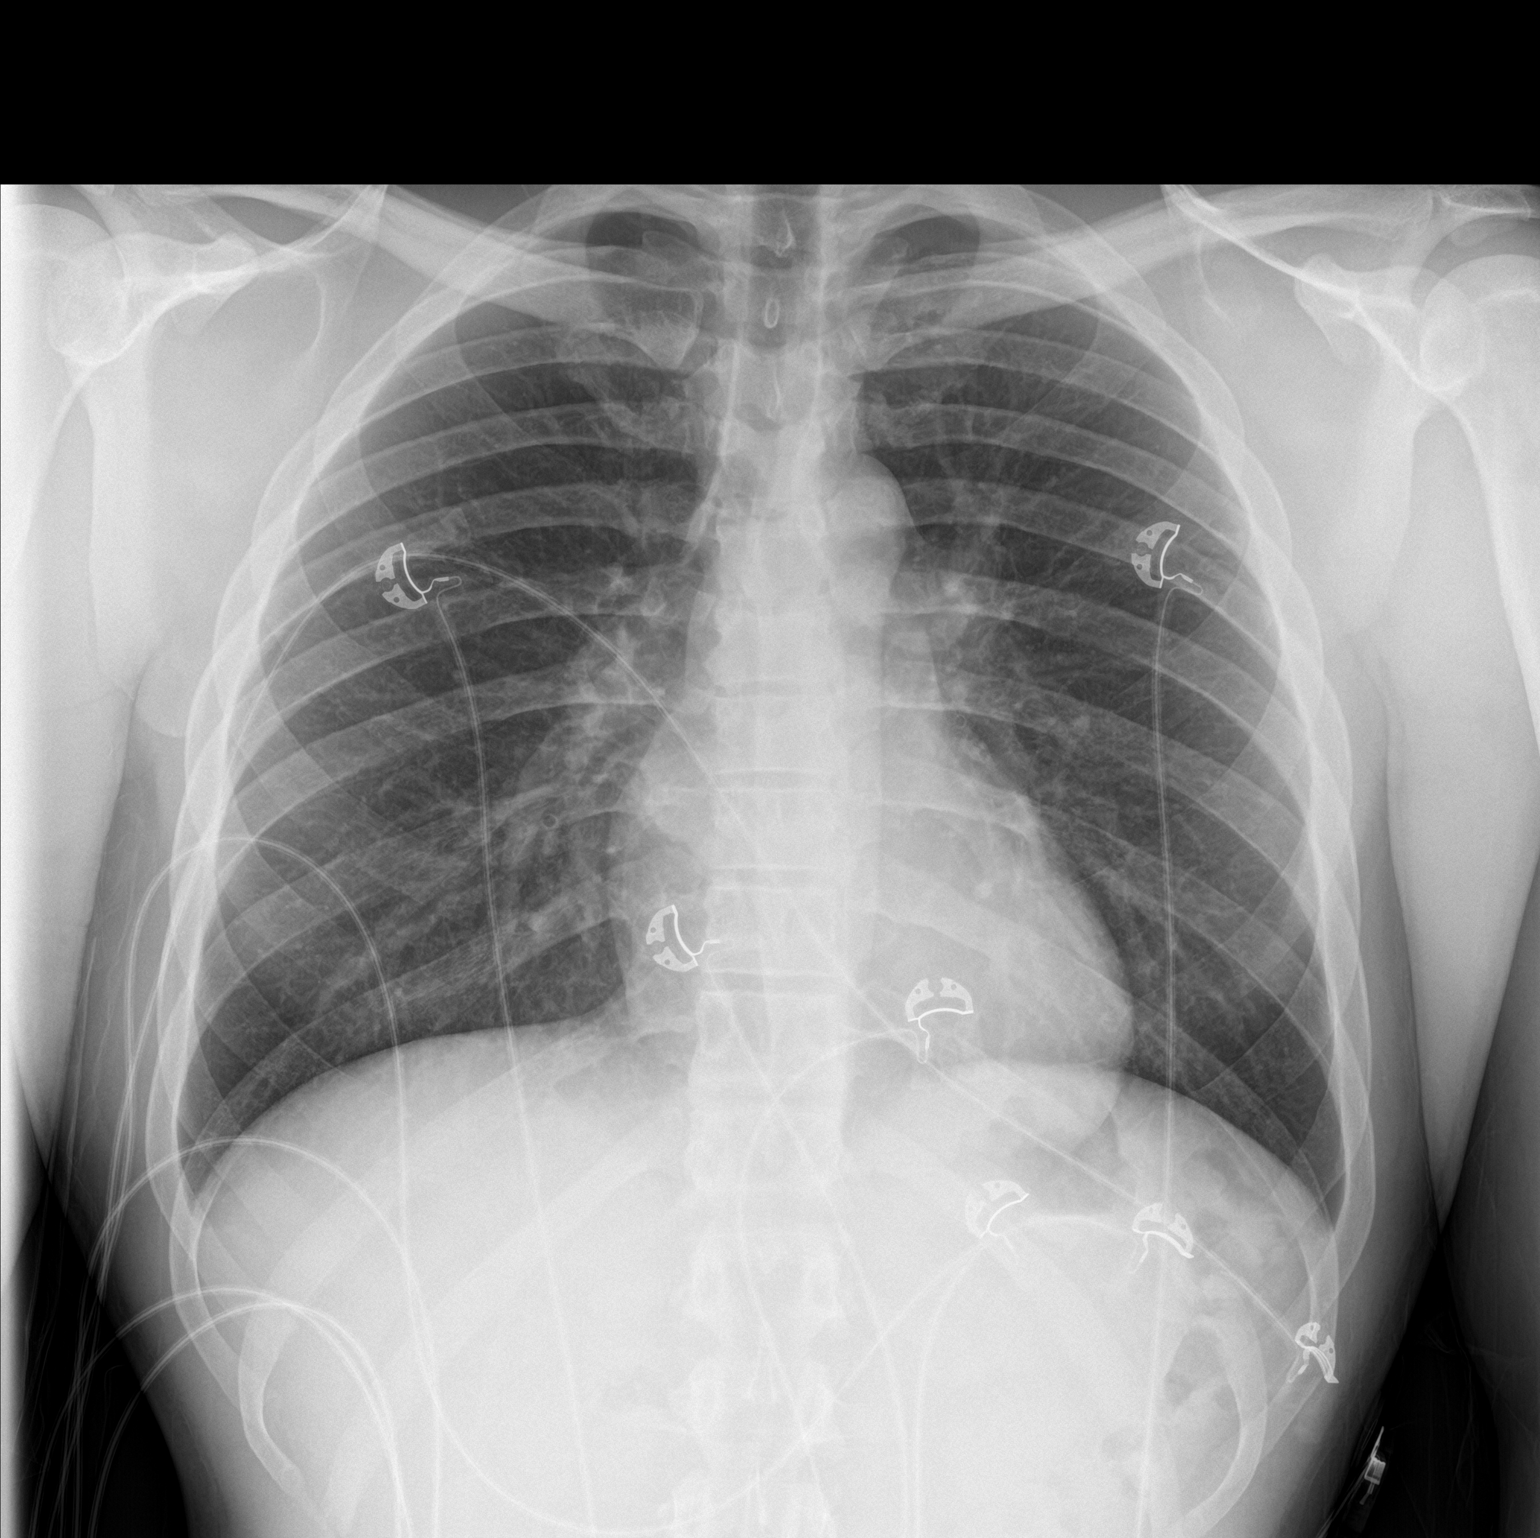

[chest lat]
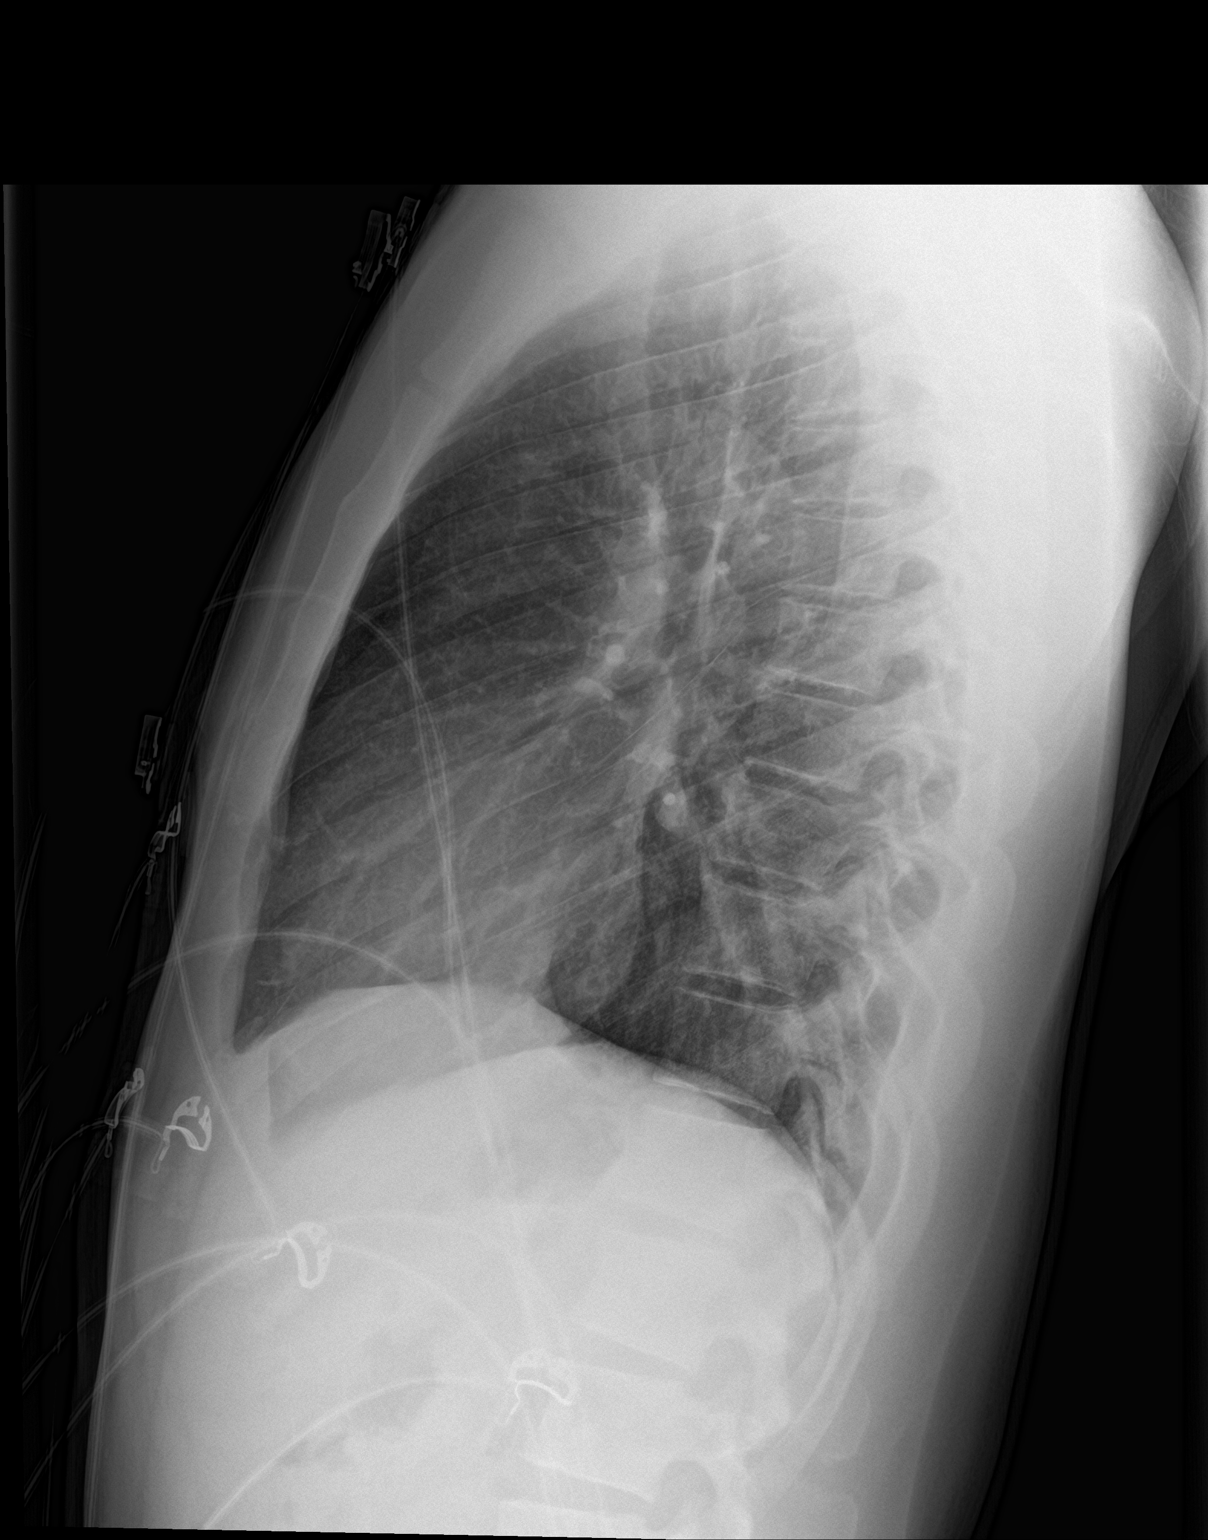

[2 of 2 positions shown; findings below may reference images not displayed]

FINDINGS: No active infiltrate or effusion is seen. Mediastinal and hilar
contours are unremarkable. The heart is within normal limits in
size. No bony abnormality is seen.
IMPRESSION: No active cardiopulmonary disease.

## 2022-12-13 ENCOUNTER — Encounter (HOSPITAL_BASED_OUTPATIENT_CLINIC_OR_DEPARTMENT_OTHER): Payer: Self-pay | Admitting: Pediatrics

## 2022-12-13 ENCOUNTER — Emergency Department (HOSPITAL_BASED_OUTPATIENT_CLINIC_OR_DEPARTMENT_OTHER): Payer: Self-pay

## 2022-12-13 ENCOUNTER — Emergency Department (HOSPITAL_BASED_OUTPATIENT_CLINIC_OR_DEPARTMENT_OTHER)
Admission: EM | Admit: 2022-12-13 | Discharge: 2022-12-13 | Disposition: A | Discharge status: Home / Self Care | Payer: Self-pay | Attending: Emergency Medicine | Admitting: Emergency Medicine

## 2022-12-13 ENCOUNTER — Other Ambulatory Visit: Payer: Self-pay

## 2022-12-13 DIAGNOSIS — E876 Hypokalemia: Secondary | ICD-10-CM | POA: Insufficient documentation

## 2022-12-13 DIAGNOSIS — U071 COVID-19: Secondary | ICD-10-CM | POA: Insufficient documentation

## 2022-12-13 LAB — COMPREHENSIVE METABOLIC PANEL
ALT: 21 U/L (ref 0–44)
AST: 28 U/L (ref 15–41)
Albumin: 3.1 g/dL — ABNORMAL LOW (ref 3.5–5.0)
Alkaline Phosphatase: 46 U/L (ref 38–126)
Anion gap: 13 (ref 5–15)
BUN: 11 mg/dL (ref 6–20)
CO2: 21 mmol/L — ABNORMAL LOW (ref 22–32)
Calcium: 8.2 mg/dL — ABNORMAL LOW (ref 8.9–10.3)
Chloride: 101 mmol/L (ref 98–111)
Creatinine, Ser: 1.11 mg/dL (ref 0.61–1.24)
GFR, Estimated: >60 mL/min (ref >60)
Glucose, Bld: 180 mg/dL — ABNORMAL HIGH (ref 70–99)
Potassium: 3.2 mmol/L — ABNORMAL LOW (ref 3.5–5.1)
Sodium: 135 mmol/L (ref 135–145)
Total Bilirubin: 0.1 mg/dL — ABNORMAL LOW (ref 0.3–1.2)
Total Protein: 5.7 g/dL — ABNORMAL LOW (ref 6.5–8.1)

## 2022-12-13 LAB — URINALYSIS, ROUTINE W REFLEX MICROSCOPIC
Bilirubin Urine: NEGATIVE
Glucose, UA: NEGATIVE mg/dL
Hgb urine dipstick: NEGATIVE
Ketones, ur: NEGATIVE mg/dL
Nitrite: NEGATIVE
Protein, ur: NEGATIVE mg/dL
Specific Gravity, Urine: 1.015 (ref 1.005–1.030)
pH: 6 (ref 5.0–8.0)

## 2022-12-13 LAB — CBC WITH DIFFERENTIAL/PLATELET
Abs Immature Granulocytes: 0.04 10*3/uL (ref 0.00–0.07)
Basophils Absolute: 0 10*3/uL (ref 0.0–0.1)
Basophils Relative: 1 %
Eosinophils Absolute: 0.1 10*3/uL (ref 0.0–0.5)
Eosinophils Relative: 1 %
HCT: 38.8 % — ABNORMAL LOW (ref 39.0–52.0)
Hemoglobin: 12.8 g/dL — ABNORMAL LOW (ref 13.0–17.0)
Immature Granulocytes: 1 %
Lymphocytes Relative: 5 %
Lymphs Abs: 0.4 10*3/uL — ABNORMAL LOW (ref 0.7–4.0)
MCH: 28.9 pg (ref 26.0–34.0)
MCHC: 33 g/dL (ref 30.0–36.0)
MCV: 87.6 fL (ref 80.0–100.0)
Monocytes Absolute: 1.1 10*3/uL — ABNORMAL HIGH (ref 0.1–1.0)
Monocytes Relative: 14 %
Neutro Abs: 6.4 10*3/uL (ref 1.7–7.7)
Neutrophils Relative %: 78 %
Platelets: 219 10*3/uL (ref 150–400)
RBC: 4.43 MIL/uL (ref 4.22–5.81)
RDW: 13.2 % (ref 11.5–15.5)
WBC: 8 10*3/uL (ref 4.0–10.5)
nRBC: 0 % (ref 0.0–0.2)

## 2022-12-13 LAB — LIPASE, BLOOD: Lipase: 28 U/L (ref 11–51)

## 2022-12-13 LAB — RESP PANEL BY RT-PCR (RSV, FLU A&B, COVID)  RVPGX2
Influenza A by PCR: NEGATIVE
Influenza B by PCR: NEGATIVE
Resp Syncytial Virus by PCR: NEGATIVE
SARS Coronavirus 2 by RT PCR: POSITIVE — AB

## 2022-12-13 LAB — URINALYSIS, MICROSCOPIC (REFLEX)

## 2022-12-13 LAB — LACTIC ACID, PLASMA
Lactic Acid, Venous: 3.2 mmol/L (ref 0.5–1.9)
Lactic Acid, Venous: 0.7 mmol/L (ref 0.5–1.9)

## 2022-12-13 LAB — OCCULT BLOOD X 1 CARD TO LAB, STOOL: Fecal Occult Bld: NEGATIVE

## 2022-12-13 MED ORDER — ACETAMINOPHEN 325 MG PO TABS
650.0000 mg | ORAL_TABLET | Freq: Once | ORAL | Status: AC
  Administered 2022-12-13: 650 mg via ORAL
  Filled 2022-12-13: qty 2

## 2022-12-13 MED ORDER — IOHEXOL 300 MG/ML  SOLN
100.0000 mL | Freq: Once | INTRAMUSCULAR | Status: AC | PRN
  Administered 2022-12-13: 100 mL via INTRAVENOUS

## 2022-12-13 MED ORDER — FAMOTIDINE IN NACL 20-0.9 MG/50ML-% IV SOLN
20.0000 mg | Freq: Once | INTRAVENOUS | Status: AC
  Administered 2022-12-13: 20 mg via INTRAVENOUS
  Filled 2022-12-13: qty 50

## 2022-12-13 MED ORDER — POTASSIUM CHLORIDE CRYS ER 20 MEQ PO TBCR
40.0000 meq | EXTENDED_RELEASE_TABLET | Freq: Once | ORAL | Status: AC
  Administered 2022-12-13: 40 meq via ORAL
  Filled 2022-12-13: qty 2

## 2022-12-13 MED ORDER — ONDANSETRON HCL 4 MG/2ML IJ SOLN
4.0000 mg | Freq: Once | INTRAMUSCULAR | Status: AC
  Administered 2022-12-13: 4 mg via INTRAVENOUS
  Filled 2022-12-13: qty 2

## 2022-12-13 MED ORDER — FENTANYL CITRATE PF 50 MCG/ML IJ SOSY
50.0000 ug | PREFILLED_SYRINGE | Freq: Once | INTRAMUSCULAR | Status: AC
  Administered 2022-12-13: 50 ug via INTRAVENOUS
  Filled 2022-12-13: qty 1

## 2022-12-13 MED ORDER — LACTATED RINGERS IV BOLUS
1000.0000 mL | Freq: Once | INTRAVENOUS | Status: AC
  Administered 2022-12-13: 1000 mL via INTRAVENOUS

## 2022-12-13 NOTE — ED Notes (Signed)
Patient transported to CT 

## 2022-12-13 NOTE — ED Provider Notes (Signed)
[place] EMERGENCY DEPARTMENT AT [facility] Provider Note   CSN: [id number] Arrival date & time: 12/13/22  1515     History  Chief Complaint  Patient presents with   Abdominal Pain    Dillon Nelson is a 37 y.o. male history of multiple stomach ulcers, ulcer of abdominal wall presented for lower abdominal pain that began last night and hematochezia for the past 5 months.  Patient states that last night he went to bed and woke up with severe lower abdominal pain specifically in the suprapubic region.  Patient then took 800 mg of ibuprofen which did not help.  Any nausea vomiting, diarrhea, constipation with this but does note that over the past 5 months he has had red stools but is unable to say if these are black or bright red or dark red.  Patient notes he does have history of stomach ulcers but states that he does not think this is related to his stomach ulcers and instead thinks that he might have colon cancer.  Patient also endorses decreased taste as well.  Patient denied chest pain, history of aortic dissection, AAA, shortness of breath, hemoptysis, dysuria, sick contacts  Home Medications Prior to Admission medications   Medication Sig Start Date End Date Taking? Authorizing Provider  naproxen (NAPROSYN) 500 MG tablet Take one tablet twice daily as needed for knee pain. 11/15/16   Charlynne Pander, MD  senna-docusate (SENOKOT-S) 8.6-50 MG tablet Take 1 tablet by mouth daily. 05/19/17   Pisciotta, Joni Reining, PA-C      Allergies    Patient has no known allergies.    Review of Systems   Review of Systems  Gastrointestinal:  Positive for abdominal pain.    Physical Exam Updated Vital Signs BP (!) 136/90 (BP Location: Right Arm)   Pulse 63   Temp 98.6 F (37 C) (Oral)   Resp 16   Ht 5\' 8"  (1.727 m)   Wt 72.6 kg   SpO2 98%   BMI 24.33 kg/m  Physical Exam Constitutional:      General: He is in acute distress.  Cardiovascular:     Rate and Rhythm: Normal  rate and regular rhythm.     Pulses: Normal pulses.     Heart sounds: Normal heart sounds.  Pulmonary:     Effort: Pulmonary effort is normal. No respiratory distress.     Breath sounds: Normal breath sounds.  Abdominal:     General: There is no distension.     Palpations: Abdomen is soft.     Tenderness: There is abdominal tenderness (Generalized). There is no guarding or rebound.  Genitourinary:    Comments: Chaperone: Spigner, RN No rectal abnormalities noted No gross hematochezia or hemorrhage Musculoskeletal:     Cervical back: Normal range of motion. No rigidity or tenderness.  Skin:    General: Skin is warm and dry.     Capillary Refill: Capillary refill takes less than 2 seconds.  Neurological:     Mental Status: He is alert.     ED Results / Procedures / Treatments   Labs (all labs ordered are listed, but only abnormal results are displayed) Labs Reviewed  RESP PANEL BY RT-PCR (RSV, FLU A&B, COVID)  RVPGX2 - Abnormal; Notable for the following components:      Result Value   SARS Coronavirus 2 by RT PCR POSITIVE (*)    All other components within normal limits  CBC WITH DIFFERENTIAL/PLATELET - Abnormal; Notable for the following components:  Hemoglobin 12.8 (*)    HCT 38.8 (*)    Lymphs Abs 0.4 (*)    Monocytes Absolute 1.1 (*)    All other components within normal limits  COMPREHENSIVE METABOLIC PANEL - Abnormal; Notable for the following components:   Potassium 3.2 (*)    CO2 21 (*)    Glucose, Bld 180 (*)    Calcium 8.2 (*)    Total Protein 5.7 (*)    Albumin 3.1 (*)    Total Bilirubin 0.1 (*)    All other components within normal limits  URINALYSIS, ROUTINE W REFLEX MICROSCOPIC - Abnormal; Notable for the following components:   Leukocytes,Ua TRACE (*)    All other components within normal limits  LACTIC ACID, PLASMA - Abnormal; Notable for the following components:   Lactic Acid, Venous 3.2 (*)    All other components within normal limits   URINALYSIS, MICROSCOPIC (REFLEX) - Abnormal; Notable for the following components:   Bacteria, UA RARE (*)    All other components within normal limits  LIPASE, BLOOD  LACTIC ACID, PLASMA  OCCULT BLOOD X 1 CARD TO LAB, STOOL    EKG None  Radiology CT ABDOMEN PELVIS W CONTRAST  Result Date: 12/13/2022 CLINICAL DATA:  Abdominal pain and chills. EXAM: CT ABDOMEN AND PELVIS WITH CONTRAST TECHNIQUE: Multidetector CT imaging of the abdomen and pelvis was performed using the standard protocol following bolus administration of intravenous contrast. RADIATION DOSE REDUCTION: This exam was performed according to the departmental dose-optimization program which includes automated exposure control, adjustment of the mA and/or kV according to patient size and/or use of iterative reconstruction technique. CONTRAST:  OMNIPAQUE IOHEXOL 300 MG/ML  SOLN COMPARISON:  July 10, 2015 FINDINGS: Lower chest: No acute abnormality. Hepatobiliary: No focal liver abnormality is seen. No gallstones, gallbladder wall thickening, or biliary dilatation. Pancreas: Unremarkable. No pancreatic ductal dilatation or surrounding inflammatory changes. Spleen: Normal in size without focal abnormality. Adrenals/Urinary Tract: Adrenal glands are unremarkable. Kidneys are normal, without renal calculi, focal lesion, or hydronephrosis. Bladder is unremarkable. Stomach/Bowel: Stomach is within normal limits. Appendix appears normal. No evidence of bowel wall thickening, distention, or inflammatory changes. Vascular/Lymphatic: No significant vascular findings are present. No enlarged abdominal or pelvic lymph nodes. Reproductive: Prostate is unremarkable. Other: No abdominal wall hernia or abnormality. No abdominopelvic ascites. Musculoskeletal: No acute or significant osseous findings. IMPRESSION: No acute or active process within the abdomen or pelvis. Electronically Signed   By: Aram Candela M.D.   On: 12/13/2022 19:20   DG Abd  Acute W/Chest  Result Date: 12/13/2022 CLINICAL DATA:  Chills and abdominal pain. EXAM: DG ABDOMEN ACUTE WITH 1 VIEW CHEST COMPARISON:  Chest x-ray 11/15/2016. CT abdomen and pelvis 07/10/2015. FINDINGS: No free intraperitoneal air identified. There are few mildly dilated small bowel loops in the central abdomen measuring up to 3 cm. Air seen throughout nondilated colon to the level the rectum. Stool burden is low. No radiopaque calculi or other significant radiographic abnormality is seen. Heart size and mediastinal contours are within normal limits. Both lungs are clear. IMPRESSION: 1. Few mildly dilated small bowel loops in the central abdomen, nonspecific but could represent ileus or early/partial small-bowel obstruction. 2. No acute cardiopulmonary disease. Electronically Signed   By: Darliss Cheney M.D.   On: 12/13/2022 18:12    Procedures Procedures    Medications Ordered in ED Medications  potassium chloride SA (KLOR-CON M) CR tablet 40 mEq (has no administration in time range)  ondansetron (ZOFRAN) injection 4 mg (4  mg Intravenous Given 12/13/22 1542)  fentaNYL (SUBLIMAZE) injection 50 mcg (50 mcg Intravenous Given 12/13/22 1543)  lactated ringers bolus 1,000 mL (0 mLs Intravenous Stopped 12/13/22 1723)  famotidine (PEPCID) IVPB 20 mg premix (0 mg Intravenous Stopped 12/13/22 1651)  fentaNYL (SUBLIMAZE) injection 50 mcg (50 mcg Intravenous Given 12/13/22 1613)  acetaminophen (TYLENOL) tablet 650 mg (650 mg Oral Given 12/13/22 1705)  iohexol (OMNIPAQUE) 300 MG/ML solution 100 mL (100 mLs Intravenous Contrast Given 12/13/22 1744)    ED Course/ Medical Decision Making/ A&P                                 Medical Decision Making Amount and/or Complexity of Data Reviewed Labs: ordered. Radiology: ordered.  Risk OTC drugs. Prescription drug management.   Calogero Geisen 37 y.o. presented today for abdominal pain. Working DDx that I considered at this time includes, but not limited to,  viral illness, gastroenteritis, colitis, small bowel obstruction, appendicitis, cholecystitis, hepatobiliary pathology, gastritis, PUD, ACS, aortic dissection pancreatitis, nephrolithiasis, AAA, UTI, pyelonephritis, testicular torsion.  R/o DDx: gastroenteritis, colitis, small bowel obstruction, appendicitis, cholecystitis, hepatobiliary pathology, gastritis, PUD, ACS, aortic dissection pancreatitis, nephrolithiasis, AAA, UTI, pyelonephritis, testicular torsion: These are considered less likely due to history of present illness, physical exam, labs/imaging findings.  Review of prior external notes: 08/10/2021 ED  Unique Tests and My Interpretation:  CBC with differential: Unremarkable CMP: Hypokalemia 3.2 Lipase: Unremarkable UA: Unremarkable Lactic acid: 3.2, 0.7 Respiratory panel: COVID-positive Occult card: Negative Acute chest x-ray: Unremarkable CT abdomen pelvis with contrast: No acute pathology  Discussion with Independent Historian:  Significant other  Discussion of Management of Tests: None  Risk: Medium: prescription drug management  Risk Stratification Score: none  Staffed with Elayne Snare, DO   Plan: On exam patient was in distress and febrile from triage.  Patient generalized tenderness throughout his abdomen however the rest of his physical exam was unremarkable.  Patient does note that his belly pain woke him up from sleep and he took ibuprofen that has not been helping and so concerned that patient may have had COVID and took the ibuprofen and with his history of gastric ulcers may have had a perforation of one of them.  Patient was also endorsing hematochezia as well and with a chaperone in the room a rectal exam was conducted that was unremarkable and had a negative occult test.  Patient's labs do show a positive COVID test however patient does have elevated lactic as well.  This could be due to dehydration this patient is only had 1 day of COVID along with the rest of his  labs and vitals being reassuring and so will not start sepsis workup at this time as do not believe antibiotics will benefit the patient as his source is most likely COVID.  Patient was given fluids and repeat lactic was reassuring.  Acute chest x-ray along with CT were also reassuring and so at this time believe patient's belly pain is COVID related and not related to peptic ulcer perforation or other intra-abdominal pathology as patient does appear more comfortable at time of discharge after receiving the pain meds.  Patient's potassium was also slightly low and will be replenished.  Patient was given return precautions. Patient stable for discharge at this time.  Patient verbalized understanding of plan.         Final Clinical Impression(s) / ED Diagnoses Final diagnoses:  COVID    Rx / DC  Orders ED Discharge Orders     None         Remi Deter 12/13/22 1934    Royanne Foots, DO 12/20/22 1352

## 2022-12-13 NOTE — ED Notes (Signed)
Patient still feeling sick on rounding. Asleep in the bed at this time. Family at bedside.

## 2022-12-13 NOTE — ED Notes (Signed)
Patient transported to X-ray 

## 2022-12-13 NOTE — ED Triage Notes (Signed)
C/O chills, abdominal, back and leg pain. States loss sense of taste as well. Patient stated "I think a had a seizure", reports he had an episode where he awoke on the floor covered in sweats.

## 2022-12-13 NOTE — Discharge Instructions (Signed)
Please follow-up with the primary care provider of attached your for you today in regards to recent ER visit.  Today your labs are reassuring however you do test positive for COVID which would explain your symptoms.  You may use Tylenol every 6 hours as needed for pain, drink plenty fluids, eat food as tolerated.  Please avoid ibuprofen as you do have history of stomach ulcers and this will worsen them.  As you are new to the area I have also attached to the GI specialist on-call for you to make an appointment with to become established.  If symptoms change or worsen please return to ER.
